# Patient Record
Sex: Female | Born: 2000 | Race: White | Hispanic: No | Marital: Single | Smoking: Never smoker
Health system: Southern US, Community
[De-identification: ages and names within clinical notes are randomized; demographics above are authoritative.]

## PROBLEM LIST (undated history)

## (undated) DIAGNOSIS — F909 Attention-deficit hyperactivity disorder, unspecified type: Secondary | ICD-10-CM

## (undated) DIAGNOSIS — L309 Dermatitis, unspecified: Secondary | ICD-10-CM

## (undated) DIAGNOSIS — F419 Anxiety disorder, unspecified: Secondary | ICD-10-CM

## (undated) DIAGNOSIS — T148XXA Other injury of unspecified body region, initial encounter: Secondary | ICD-10-CM

## (undated) DIAGNOSIS — I10 Essential (primary) hypertension: Secondary | ICD-10-CM

## (undated) DIAGNOSIS — E282 Polycystic ovarian syndrome: Secondary | ICD-10-CM

## (undated) DIAGNOSIS — E669 Obesity, unspecified: Secondary | ICD-10-CM

## (undated) DIAGNOSIS — F32A Depression, unspecified: Secondary | ICD-10-CM

## (undated) DIAGNOSIS — M7989 Other specified soft tissue disorders: Secondary | ICD-10-CM

## (undated) DIAGNOSIS — F329 Major depressive disorder, single episode, unspecified: Secondary | ICD-10-CM

## (undated) HISTORY — DX: Essential (primary) hypertension: I10

## (undated) HISTORY — DX: Obesity, unspecified: E66.9

## (undated) HISTORY — DX: Attention-deficit hyperactivity disorder, unspecified type: F90.9

## (undated) HISTORY — DX: Major depressive disorder, single episode, unspecified: F32.9

## (undated) HISTORY — PX: TONSILLECTOMY: SUR1361

## (undated) HISTORY — DX: Anxiety disorder, unspecified: F41.9

## (undated) HISTORY — DX: Polycystic ovarian syndrome: E28.2

## (undated) HISTORY — DX: Depression, unspecified: F32.A

## (undated) HISTORY — DX: Other specified soft tissue disorders: M79.89

---
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
## 2020-01-07 DIAGNOSIS — E669 Obesity, unspecified: Secondary | ICD-10-CM | POA: Insufficient documentation

## 2020-01-07 LAB — HEMOGLOBIN A1C: Hemoglobin A1C: 5.2

## 2020-01-07 LAB — CBC AND DIFFERENTIAL
HCT: 45 (ref 36–46)
Hemoglobin: 15.1 (ref 12.0–16.0)
Platelets: 215 (ref 150–399)
WBC: 7.7

## 2020-01-07 LAB — CBC: RBC: 5.21 — AB (ref 3.87–5.11)

## 2020-01-07 LAB — TSH: TSH: 2.24 (ref 0.41–5.90)

## 2020-02-01 ENCOUNTER — Encounter: Payer: BC Managed Care – PPO | Admitting: Physician Assistant

## 2020-02-03 ENCOUNTER — Encounter: Payer: Self-pay | Admitting: Physician Assistant

## 2020-02-03 ENCOUNTER — Other Ambulatory Visit: Payer: Self-pay

## 2020-02-03 ENCOUNTER — Ambulatory Visit (INDEPENDENT_AMBULATORY_CARE_PROVIDER_SITE_OTHER): Payer: 59 | Admitting: Physician Assistant

## 2020-02-03 DIAGNOSIS — F410 Panic disorder [episodic paroxysmal anxiety] without agoraphobia: Secondary | ICD-10-CM

## 2020-02-03 DIAGNOSIS — F902 Attention-deficit hyperactivity disorder, combined type: Secondary | ICD-10-CM | POA: Diagnosis not present

## 2020-02-03 MED ORDER — AMPHETAMINE-DEXTROAMPHETAMINE 20 MG PO TABS: 20.0000 mg | ORAL_TABLET | Freq: Every day | ORAL | 0 refills | Status: DC

## 2020-02-03 MED ORDER — HYDROXYZINE HCL 25 MG PO TABS: ORAL_TABLET | ORAL | 0 refills | Status: DC

## 2020-02-03 NOTE — Patient Instructions (Signed)
It was great to see you!  Please follow-up in 3 months to check in on your adhd, sooner if concerns.  Take care,  Jarold Motto PA-C

## 2020-02-03 NOTE — Progress Notes (Signed)
Jill Mccann is a 19 y.o. female is here for transfer of care.  I acted as a Neurosurgeon for [employer], PA-C Corky Mull, LPN  History of Present Illness:   Chief Complaint  Patient presents with  . Transfer of care  . ADHD  . Anxiety  . Depression    HPI  Pt is here for transfer of care today from Dr. Earlene Plater.  ADHD Pt would like to go back on Adderall 20 mg daily. Has been off medication for a year now. Pt is in college, having trouble focusing and helps with anxiety. Rarely took holidays from her medication. Was prior on Adderall 20 mg BID  Anxiety & Depression Pt following up, she would like to go back on medication. Pt is having trouble with depression right now due to COVID and family issues. Pt says her anxiety is okay. Denies SI/HI. Pt has tried therapy in the past but has trouble opening up. Hydroxyzine takes as needed for panic attacks.  She could not tolerate SSRI's due to making her stomach upset.    Health Maintenance Due  Topic Date Due  . HIV Screening  Never done    Past Medical History:  Diagnosis Date  . ADHD   . Anxiety   . Depression   . Eczema   . PCOS (polycystic ovarian syndrome)      Social History   Socioeconomic History  . Marital status: Single    Spouse name: Not on file  . Number of children: Not on file  . Years of education: Not on file  . Highest education level: Not on file  Occupational History  . Not on file  Tobacco Use  . Smoking status: Never Smoker  . Smokeless tobacco: Never Used  Substance and Sexual Activity  . Alcohol use: No  . Drug use: No  . Sexual activity: Never  Other Topics Concern  . Not on file  Social History Narrative   [employer] in [employer]   Live with parents   Hobbies: horses   Social Determinants of Health   Financial Resource Strain:   . Difficulty of Paying Living Expenses:   Food Insecurity:   . Worried About Programme researcher, broadcasting/film/video in the Last Year:   . Barista in the  Last Year:   Transportation Needs:   . Freight forwarder (Medical):   Marland Kitchen Lack of Transportation (Non-Medical):   Physical Activity:   . Days of Exercise per Week:   . Minutes of Exercise per Session:   Stress:   . Feeling of Stress :   Social Connections:   . Frequency of Communication with Friends and Family:   . Frequency of Social Gatherings with Friends and Family:   . Attends Religious Services:   . Active Member of Clubs or Organizations:   . Attends Banker Meetings:   Marland Kitchen Marital Status:   Intimate Partner Violence:   . Fear of Current or Ex-Partner:   . Emotionally Abused:   Marland Kitchen Physically Abused:   . Sexually Abused:     Past Surgical History:  Procedure Laterality Date  . TONSILLECTOMY      Family History  Problem Relation Age of Onset  . Arthritis Mother   . Depression Mother   . Hypertension Mother   . Miscarriages / [place] Mother   . Arthritis Father   . Diabetes Father   . Heart disease Father   . Hyperlipidemia Father   . Hypertension  Father   . Asthma Sister   . Stroke Maternal Grandmother   . Alcohol abuse Maternal Grandfather   . Early death Maternal Grandfather   . Hearing loss Maternal Grandfather   . Hyperlipidemia Maternal Grandfather   . Arthritis Paternal Grandmother   . Diabetes Paternal Grandfather   . Hypertension Paternal Grandfather   . Kidney disease Paternal Grandfather     PMHx, SurgHx, SocialHx, FamHx, Medications, and Allergies were reviewed in the Visit Navigator and updated as appropriate.   Patient Active Problem List   Diagnosis Date Noted  . Obesity 01/07/2020  . 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 Severe episode of recurrent major depressive disorder, without psychotic features ([place]) 08/13/2018  . Panic attacks 08/13/2018  . Attention deficit hyperactivity disorder (ADHD), combined type  08/13/2018  . Primary insomnia 08/13/2018  . PCOS (polycystic ovarian syndrome) 07/28/2018    Social History   Tobacco Use  . Smoking status: Never Smoker  . Smokeless tobacco: Never Used  Substance Use Topics  . Alcohol use: No  . Drug use: No    Current Medications and Allergies:    Current Outpatient Medications:  .  etonogestrel (NEXPLANON) 68 MG IMPL implant, Nexplanon 68 mg subdermal implant  Inject by subcutaneous route., Disp: , Rfl:  .  hydrOXYzine (ATARAX/VISTARIL) 25 MG tablet, TAKE 1 TAB 30-60 MINS PRIOR TO BEDTIME AS NEEDED FOR INSOMNIA/ANXIETY. MAY INCREASE TO 2 TABS, Disp: 60 tablet, Rfl: 0 .  amphetamine-dextroamphetamine (ADDERALL) 20 MG tablet, Take 1 tablet (20 mg total) by mouth 2 (two) times daily. (Patient not taking: Reported on 10/05/2019), Disp: 60 tablet, Rfl: 0 .  amphetamine-dextroamphetamine (ADDERALL) 20 MG tablet, Take 1 tablet (20 mg total) by mouth daily before breakfast., Disp: 30 tablet, Rfl: 0 .  [START ON 03/04/2020] amphetamine-dextroamphetamine (ADDERALL) 20 MG tablet, Take 1 tablet (20 mg total) by mouth daily before breakfast., Disp: 30 tablet, Rfl: 0 .  [START ON 04/03/2020] amphetamine-dextroamphetamine (ADDERALL) 20 MG tablet, Take 1 tablet (20 mg total) by mouth daily before breakfast., Disp: 30 tablet, Rfl: 0   Allergies  Allergen Reactions  . Hydrocodone Nausea And Vomiting    Review of Systems   ROS  Negative unless otherwise specified per HPI.  Vitals:   Vitals:   02/03/20 1335  BP: 130/80  Pulse: 75  Temp: 98.7 F (37.1 C)  TempSrc: Temporal  SpO2: 97%  Weight: 252 lb 4 oz (114.4 kg)  Height: 5' 3.5" (1.613 m)     Body mass index is 43.98 kg/m.   Physical Exam:    Physical Exam Vitals and nursing note reviewed.  Constitutional:      General: She is not in acute distress.    Appearance: She is well-developed. She is not ill-appearing or toxic-appearing.  Cardiovascular:     Rate and Rhythm: Normal rate and  regular rhythm.     Pulses: Normal pulses.     Heart sounds: Normal heart sounds, S1 normal and S2 normal.     Comments: No LE edema Pulmonary:     Effort: Pulmonary effort is normal.     Breath sounds: Normal breath sounds.  Skin:    General: Skin is warm and dry.  Neurological:     Mental Status: She is alert.     GCS: GCS eye subscore is 4. GCS verbal subscore is 5. GCS motor subscore is 6.  Psychiatric:        Speech:  Speech normal.        Behavior: Behavior normal. Behavior is cooperative.      Assessment and Plan:    Kadejah was seen today for transfer of care, adhd, anxiety and depression.  Diagnoses and all orders for this visit:  Attention deficit hyperactivity disorder (ADHD), combined type [place] Controlled Substance Database reviewed today regarding patient. Patient is compliant with [facility] regarding pharmacy use and one-prescribing provider. It is appropriate to continue current medication regimen. Drug contract completed today -- Adderall 20 mg daily -- refill for 3 months given.  Panic attacks Hydroxyzine refilled. She declines any further medication at this time. I discussed with patient that if they develop any SI, to tell someone immediately and seek medical attention. -     hydrOXYzine (ATARAX/VISTARIL) 25 MG tablet; TAKE 1 TAB 30-60 MINS PRIOR TO BEDTIME AS NEEDED FOR INSOMNIA/ANXIETY. MAY INCREASE TO 2 TABS  Other orders -     amphetamine-dextroamphetamine (ADDERALL) 20 MG tablet; Take 1 tablet (20 mg total) by mouth daily before breakfast. -     amphetamine-dextroamphetamine (ADDERALL) 20 MG tablet; Take 1 tablet (20 mg total) by mouth daily before breakfast. -     amphetamine-dextroamphetamine (ADDERALL) 20 MG tablet; Take 1 tablet (20 mg total) by mouth daily before breakfast.  Reviewed expectations re: course of current medical issues. . Discussed self-management of symptoms. . Outlined signs and symptoms indicating need for more acute intervention. . Patient  verbalized understanding and all questions were answered. . See orders for this visit as documented in the electronic medical record. . Patient received an After Visit Summary.  CMA or LPN served as scribe during this visit. History, Physical, and Plan performed by medical provider. The above documentation has been reviewed and is accurate and complete.  Jarold Motto, PA-C [place] [place] 02/03/2020  Follow-up: No follow-ups on file.

## 2020-02-18 ENCOUNTER — Encounter: Payer: Self-pay | Admitting: Physician Assistant

## 2020-03-05 ENCOUNTER — Other Ambulatory Visit: Payer: Self-pay | Admitting: Physician Assistant

## 2020-03-05 DIAGNOSIS — F410 Panic disorder [episodic paroxysmal anxiety] without agoraphobia: Secondary | ICD-10-CM

## 2020-03-07 NOTE — Telephone Encounter (Signed)
Ok for 90 day supply.  

## 2020-04-17 ENCOUNTER — Encounter: Payer: Self-pay | Admitting: Physician Assistant

## 2020-04-19 ENCOUNTER — Telehealth: Payer: 59 | Admitting: Physician Assistant

## 2020-04-20 ENCOUNTER — Ambulatory Visit: Payer: 59 | Admitting: Physician Assistant

## 2020-05-04 ENCOUNTER — Ambulatory Visit: Payer: 59 | Admitting: Physician Assistant

## 2020-05-04 DIAGNOSIS — Z0289 Encounter for other administrative examinations: Secondary | ICD-10-CM

## 2020-05-04 NOTE — Progress Notes (Deleted)
Jill Mccann is a 19 y.o. female is here to discuss: ADHD  I acted as a Neurosurgeon for [employer], PA-C [place], [place]  History of Present Illness:   No chief complaint on file.   HPI  ADHD  Anxiety Health Maintenance Due  Topic Date Due  . Hepatitis C Screening  Never done  . COVID-19 Vaccine (1) Never done  . HIV Screening  Never done    Past Medical History:  Diagnosis Date  . ADHD   . Anxiety   . Depression   . Eczema   . PCOS (polycystic ovarian syndrome)      Social History   Tobacco Use  . Smoking status: Never Smoker  . Smokeless tobacco: Never Used  Substance Use Topics  . Alcohol use: No  . Drug use: No    Past Surgical History:  Procedure Laterality Date  . TONSILLECTOMY      Family History  Problem Relation Age of Onset  . Arthritis Mother   . Depression Mother   . Hypertension Mother   . Miscarriages / [place] Mother   . Arthritis Father   . Diabetes Father   . Heart disease Father   . Hyperlipidemia Father   . Hypertension Father   . Asthma Sister   . Stroke Maternal Grandmother   . Alcohol abuse Maternal Grandfather   . Early death Maternal Grandfather   . Hearing loss Maternal Grandfather   . Hyperlipidemia Maternal Grandfather   . Arthritis Paternal Grandmother   . Diabetes Paternal Grandfather   . Hypertension Paternal Grandfather   . Kidney disease Paternal Grandfather     PMHx, SurgHx, SocialHx, FamHx, Medications, and Allergies were reviewed in the Visit Navigator and updated as appropriate.   Patient Active Problem List   Diagnosis Date Noted  . Obesity 01/07/2020  . 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 Severe episode of recurrent major depressive disorder, without psychotic features ([facility]) 08/13/2018  . Panic attacks 08/13/2018  . Attention deficit hyperactivity disorder (ADHD), combined type 08/13/2018   . Primary insomnia 08/13/2018  . PCOS (polycystic ovarian syndrome) 07/28/2018    Social History   Tobacco Use  . Smoking status: Never Smoker  . Smokeless tobacco: Never Used  Substance Use Topics  . Alcohol use: No  . Drug use: No    Current Medications and Allergies:    Current Outpatient Medications:  .  amphetamine-dextroamphetamine (ADDERALL) 20 MG tablet, Take 1 tablet (20 mg total) by mouth 2 (two) times daily. (Patient not taking: Reported on 10/05/2019), Disp: 60 tablet, Rfl: 0 .  amphetamine-dextroamphetamine (ADDERALL) 20 MG tablet, Take 1 tablet (20 mg total) by mouth daily before breakfast., Disp: 30 tablet, Rfl: 0 .  amphetamine-dextroamphetamine (ADDERALL) 20 MG tablet, Take 1 tablet (20 mg total) by mouth daily before breakfast., Disp: 30 tablet, Rfl: 0 .  amphetamine-dextroamphetamine (ADDERALL) 20 MG tablet, Take 1 tablet (20 mg total) by mouth daily before breakfast., Disp: 30 tablet, Rfl: 0 .  etonogestrel (NEXPLANON) 68 MG IMPL implant, Nexplanon 68 mg subdermal implant  Inject by subcutaneous route., Disp: , Rfl:  .  hydrOXYzine (ATARAX/VISTARIL) 25 MG tablet, TAKE 1 TAB 30-60 MINS PRIOR TO BEDTIME AS NEEDED FOR INSOMNIA/ANXIETY. MAY INCREASE TO 2 TABS, Disp: 180 tablet, Rfl: 1  Allergies  Allergen Reactions  . Hydrocodone Nausea And Vomiting    Review of Systems   ROS  Vitals:  There were no vitals  filed for this visit.   There is no height or weight on file to calculate BMI.   Physical Exam:    Physical Exam   Assessment and Plan:    There are no diagnoses linked to this encounter.  . Reviewed expectations re: course of current medical issues. . Discussed self-management of symptoms. . Outlined signs and symptoms indicating need for more acute intervention. . Patient verbalized understanding and all questions were answered. . See orders for this visit as documented in the electronic medical record. . Patient received an After Visit  Summary.  ***  Jarold Motto, PA-C [place], [facility] [place] 05/04/2020  Follow-up: No follow-ups on file.

## 2020-05-05 ENCOUNTER — Other Ambulatory Visit: Payer: Self-pay | Admitting: Family Medicine

## 2020-05-19 ENCOUNTER — Encounter: Payer: Self-pay | Admitting: Physician Assistant

## 2020-11-20 ENCOUNTER — Encounter: Payer: Self-pay | Admitting: Physician Assistant

## 2020-11-22 ENCOUNTER — Other Ambulatory Visit: Payer: Self-pay

## 2020-11-22 ENCOUNTER — Encounter: Payer: Self-pay | Admitting: Physician Assistant

## 2020-11-22 ENCOUNTER — Telehealth (INDEPENDENT_AMBULATORY_CARE_PROVIDER_SITE_OTHER): Payer: 59 | Admitting: Physician Assistant

## 2020-11-22 VITALS — Ht 63.5 in | Wt 230.0 lb

## 2020-11-22 DIAGNOSIS — R059 Cough, unspecified: Secondary | ICD-10-CM | POA: Diagnosis not present

## 2020-11-22 NOTE — Progress Notes (Signed)
Virtual Visit via Video   I connected with Jill Mccann on 11/22/20 at 11:30 AM EST by a video enabled telemedicine application and verified that I am speaking with the correct person using two identifiers. Location patient: Home Location provider: [facility], Office Persons participating in the virtual visit: Edwin Ackert, Jarold Motto PA-C, Corky Mull, LPN   I discussed the limitations of evaluation and management by telemedicine and the availability of in person appointments. The patient expressed understanding and agreed to proceed.  I acted as a Neurosurgeon for [employer], PA-C [employer], LPN   Subjective:   HPI:   URI Symptom onset: Thursday  Travel/contacts:  none  Vaccination status: 1 shot  Testing results: pt took 2 at home tests Neg, PCR test is pending at [facility] -- should return today or tomorrow  Patient endorses the following symptoms: Sore throat resolved now has ongoing cough and congestion  Patient denies the following symptoms: Fever or chills, chest tightness, SOB  Treatments tried: Dayquil & Nyquil  Patient risk factors: Current COVID-19 risk of complications score: 1 Smoking status: Jill Mccann  reports that she has never smoked. She has never used smokeless tobacco. If female, currently pregnant? []   Yes [x]   No  ROS: See pertinent positives and negatives per HPI.  Patient Active Problem List   Diagnosis Date Noted  . Obesity 01/07/2020  . 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 Severe episode of recurrent major depressive disorder, without psychotic features ([facility]) 08/13/2018  . Panic attacks 08/13/2018  . Attention deficit hyperactivity disorder (ADHD), combined type 08/13/2018  . Primary insomnia 08/13/2018  . PCOS (polycystic ovarian syndrome) 07/28/2018    Social History   Tobacco Use  . Smoking status: Never Smoker  .  Smokeless tobacco: Never Used  Substance Use Topics  . Alcohol use: No    Current Outpatient Medications:  .  etonogestrel (NEXPLANON) 68 MG IMPL implant, Nexplanon 68 mg subdermal implant  Inject by subcutaneous route., Disp: , Rfl:  .  hydrOXYzine (ATARAX/VISTARIL) 25 MG tablet, TAKE 1 TAB 30-60 MINS PRIOR TO BEDTIME AS NEEDED FOR INSOMNIA/ANXIETY. MAY INCREASE TO 2 TABS, Disp: 180 tablet, Rfl: 1  Allergies  Allergen Reactions  . Hydrocodone Nausea And Vomiting    Objective:   VITALS: Per patient if applicable, see vitals. GENERAL: Alert, appears well and in no acute distress. HEENT: Atraumatic, conjunctiva clear, no obvious abnormalities on inspection of external nose and ears. NECK: Normal movements of the head and neck. CARDIOPULMONARY: No increased WOB. Speaking in clear sentences. I:E ratio WNL.  MS: Moves all visible extremities without noticeable abnormality. PSYCH: Pleasant and cooperative, well-groomed. Speech normal rate and rhythm. Affect is appropriate. Insight and judgement are appropriate. Attention is focused, linear, and appropriate.  NEURO: CN grossly intact. Oriented as arrived to appointment on time with no prompting. Moves both UE equally.  SKIN: No obvious lesions, wounds, erythema, or cyanosis noted on face or hands.  Assessment and Plan:   [name] was seen today for covid symptoms.  Diagnoses and all orders for this visit:  Cough   No red flags on discussion, patient is not in any obvious distress during our visit. Discussed progression of most viral illnesses, and recommended supportive care at this point in time.  Recommend OTC mucinex DM to help with symptoms. Discussed over the counter supportive care options, including Tylenol 500 mg q 8 hours, with recommendations to push fluids and  rest. Reviewed return precautions including new/worsening fever, SOB, new/worsening cough, sudden onset changes of symptoms. Recommended need to self-quarantine and  practice social distancing until symptoms resolve. I recommend that patient follow-up if symptoms worsen or persist despite treatment x 7-10 days, sooner if needed.  I discussed the assessment and treatment plan with the patient. The patient was provided an opportunity to ask questions and all were answered. The patient agreed with the plan and demonstrated an understanding of the instructions.   The patient was advised to call back or seek an in-person evaluation if the symptoms worsen or if the condition fails to improve as anticipated.   CMA or LPN served as scribe during this visit. History, Physical, and Plan performed by medical provider. The above documentation has been reviewed and is accurate and complete.  [place], [place] 11/22/2020

## 2020-11-23 ENCOUNTER — Other Ambulatory Visit: Payer: Self-pay | Admitting: Physician Assistant

## 2020-11-23 MED ORDER — AZITHROMYCIN 250 MG PO TABS
ORAL_TABLET | ORAL | 0 refills | Status: DC
Start: 2020-11-23 — End: 2021-10-03

## 2021-02-21 HISTORY — PX: OTHER SURGICAL HISTORY: SHX169

## 2021-10-03 ENCOUNTER — Ambulatory Visit (INDEPENDENT_AMBULATORY_CARE_PROVIDER_SITE_OTHER): Payer: 59 | Admitting: Physician Assistant

## 2021-10-03 ENCOUNTER — Other Ambulatory Visit: Payer: Self-pay

## 2021-10-03 ENCOUNTER — Encounter: Payer: Self-pay | Admitting: Physician Assistant

## 2021-10-03 VITALS — BP 138/82 | HR 113 | Temp 98.2°F | Ht 63.0 in | Wt 299.4 lb

## 2021-10-03 DIAGNOSIS — F902 Attention-deficit hyperactivity disorder, combined type: Secondary | ICD-10-CM | POA: Diagnosis not present

## 2021-10-03 DIAGNOSIS — Z0001 Encounter for general adult medical examination with abnormal findings: Secondary | ICD-10-CM | POA: Diagnosis not present

## 2021-10-03 DIAGNOSIS — G43909 Migraine, unspecified, not intractable, without status migrainosus: Secondary | ICD-10-CM | POA: Insufficient documentation

## 2021-10-03 DIAGNOSIS — E669 Obesity, unspecified: Secondary | ICD-10-CM

## 2021-10-03 DIAGNOSIS — F332 Major depressive disorder, recurrent severe without psychotic features: Secondary | ICD-10-CM | POA: Diagnosis not present

## 2021-10-03 MED ORDER — LISDEXAMFETAMINE DIMESYLATE 30 MG PO CAPS: 30.0000 mg | ORAL_CAPSULE | Freq: Every day | ORAL | 0 refills | Status: DC

## 2021-10-03 NOTE — Progress Notes (Signed)
Subjective:    Jill Mccann is a 20 y.o. female and is here for a comprehensive physical exam.  HPI  Health Maintenance Due  Topic Date Due   COVID-19 Vaccine (1) Never done   Hepatitis C Screening  Never done   INFLUENZA VACCINE  05/22/2021    Acute Concerns: Obesity Since our previous visit on 11/22/20, Jill Mccann has gained bout 69 pounds which she believes to be caused by her depression. Reports she was getting her exercise by going out to her barn and riding horses. Since her decreased mood she has not had the motivation to do so.   Chronic Issues: Anxiety/Depression Jill Mccann is still experiencing a decrease in her mood. States it is not as bad as it was in the past but she has been told by those around her that they see a decline in her mood. Reports she has noticed that she has been sleeping more often and not wanting to do things that she used to enjoy. At this time she is taking hydroxyzine 12.5 mg as needed, about a couple of times a month. She feels this does work for her to prevent or treat her panic attacks. Denies SI/HI.  ADHD At this time, Jill Mccann states she is finding it hard to focus when it comes to school due to this issue. It doesn't make it easier that her classes are online as well. She is interested in restarting Adderall 20 mg daily or any other medication.   Health Maintenance: Immunizations -- Covid- Due at this time Influenza- Due at this time  Tdap- UTD; 2020 PAP -- Due at this time Bone Density -- N/A Dentistry- UTD Ophthalmology- UTD Diet -- Eats all food groups Sleep habits -- Normal schedule Exercise -- Not regularly at this time Current Weight -- Increased by 69 lbs due to decreased mood Weight History: Wt Readings from Last 10 Encounters:  10/03/21 299 lb 6.4 oz (135.8 kg)  11/22/20 230 lb (104.3 kg) (99 %, Z= 2.31)*  02/03/20 252 lb 4 oz (114.4 kg) (>99 %, Z= 2.48)*  10/05/19 245 lb (111.1 kg) (>99 %, Z= 2.42)*  02/17/19 240 lb 9.6 oz (109.1  kg) (>99 %, Z= 2.38)*  12/10/18 235 lb 6.4 oz (106.8 kg) (>99 %, Z= 2.35)*  10/28/18 250 lb (113.4 kg) (>99 %, Z= 2.46)*  09/08/18 243 lb (110.2 kg) (>99 %, Z= 2.41)*  08/29/18 248 lb (112.5 kg) (>99 %, Z= 2.45)*  08/27/18 248 lb 14.4 oz (112.9 kg) (>99 %, Z= 2.46)*   * Growth percentiles are based on CDC (Girls, 2-20 Years) data.   Body mass index is 53.04 kg/m. Mood -- Uncontrolled; No SI/HI.  No LMP recorded. Patient has had an implant. Period characteristics -- Normal Birth control -- Nexplanon implant     reports no history of alcohol use.  Tobacco Use: Low Risk    Smoking Tobacco Use: Never   Smokeless Tobacco Use: Never   Passive Exposure: Not on file     Depression screen [facility] 2/9 10/03/2021  Decreased Interest 0  Down, Depressed, Hopeless 3  PHQ - 2 Score 3  Altered sleeping -  Tired, decreased energy -  Change in appetite -  Feeling bad or failure about yourself  -  Trouble concentrating -  Moving slowly or fidgety/restless -  Suicidal thoughts -  PHQ-9 Score -  Difficult doing work/chores -     Other providers/specialists: Patient Care Team: Jarold Motto, [place] as PCP - General (Physician Assistant)  PMHx, SurgHx, SocialHx, Medications, and Allergies were reviewed in the Visit Navigator and updated as appropriate.   Past Medical History:  Diagnosis Date   ADHD    Anxiety    Depression    Eczema    PCOS (polycystic ovarian syndrome)      Past Surgical History:  Procedure Laterality Date   TONSILLECTOMY       Family History  Problem Relation Age of Onset   Arthritis Mother    Depression Mother    Hypertension Mother    Miscarriages / [place] Mother    Arthritis Father    Diabetes Father    Heart disease Father    Hyperlipidemia Father    Hypertension Father    Asthma Sister    Stroke Maternal Grandmother    Alcohol abuse Maternal Grandfather    Early death Maternal Grandfather    Hearing loss Maternal Grandfather     Hyperlipidemia Maternal Grandfather    Arthritis Paternal Grandmother    Diabetes Paternal Grandfather    Hypertension Paternal Grandfather    Kidney disease Paternal Grandfather     Social History   Tobacco Use   Smoking status: Never   Smokeless tobacco: Never  Substance Use Topics   Alcohol use: No   Drug use: No    Review of Systems:   Review of Systems  Constitutional:  Negative for chills, fever, malaise/fatigue and weight loss.  HENT:  Negative for hearing loss, sinus pain and sore throat.   Respiratory:  Negative for cough and hemoptysis.   Cardiovascular:  Negative for chest pain, palpitations, leg swelling and PND.  Gastrointestinal:  Negative for abdominal pain, constipation, diarrhea, heartburn, nausea and vomiting.  Genitourinary:  Negative for dysuria, frequency and urgency.  Musculoskeletal:  Negative for back pain, myalgias and neck pain.  Skin:  Negative for itching and rash.  Neurological:  Negative for dizziness, tingling, seizures and headaches.  Endo/Heme/Allergies:  Negative for polydipsia.  Psychiatric/Behavioral:  Negative for depression. The patient is not nervous/anxious.    Objective:   BP (!) 142/88    Pulse (!) 113    Temp 98.2 F (36.8 C) (Temporal)    Ht 5\' 3"  (1.6 m)    Wt 299 lb 6.4 oz (135.8 kg)    SpO2 98%    BMI 53.04 kg/m   General Appearance:    Alert, cooperative, no distress, appears stated age  Head:    Normocephalic, without obvious abnormality, atraumatic  Eyes:    PERRL, conjunctiva/corneas clear, EOM's intact, fundi    benign, both eyes  Ears:    Normal TM's and external ear canals, both ears  Nose:   Nares normal, septum midline, mucosa normal, no drainage    or sinus tenderness  Throat:   Lips, mucosa, and tongue normal; teeth and gums normal  Neck:   Supple, symmetrical, trachea midline, no adenopathy;    thyroid:  no enlargement/tenderness/nodules; no carotid   bruit or JVD  Back:     Symmetric, no curvature, ROM normal,  no CVA tenderness  Lungs:     Clear to auscultation bilaterally, respirations unlabored  Chest Wall:    No tenderness or deformity   Heart:    Regular rate and rhythm, S1 and S2 normal, no murmur, rub   or gallop  Breast Exam:    Deferred   Abdomen:     Soft, non-tender, bowel sounds active all four quadrants,    no masses, no organomegaly  Genitalia:    Normal female  without lesion, discharge or tenderness  Rectal:    Deferred  Extremities:   Extremities normal, atraumatic, no cyanosis or edema  Pulses:   2+ and symmetric all extremities  Skin:   Skin color, texture, turgor normal, no rashes or lesions  Lymph nodes:   Cervical, supraclavicular, and axillary nodes normal  Neurologic:   CNII-XII intact, normal strength, sensation and reflexes    throughout    Assessment/Plan:   Encounter for general adult medical examination with abnormal findings Today patient counseled on age appropriate routine health concerns for screening and prevention, each reviewed and up to date or declined. Immunizations reviewed and up to date or declined. Labs ordered and reviewed. Risk factors for depression reviewed and negative. Hearing function and visual acuity are intact. ADLs screened and addressed as needed. Functional ability and level of safety reviewed and appropriate. Education, counseling and referrals performed based on assessed risks today. Patient provided with a copy of personalized plan for preventive services.   Attention deficit hyperactivity disorder (ADHD), combined type Uncontrolled No red flags upon database review Start Vyvanse 30 mg daily; coupon given Follow up in a month, sooner if concerns occur   Severe episode of recurrent major depressive disorder, without psychotic features (HCC) Uncontrolled She would like to start medication for ADHD at this time and then revisit starting medication in 1 month Declines referral for talk therapy at this time Patient denies SI/HI at  today's visit I advised patient that if they develop any SI, to tell someone immediately and seek medical attention  Obesity, unspecified classification, unspecified obesity type, unspecified whether serious comorbidity present Will update labs, will start medication as indicated by lab results Encouraged patient to participate in regular exercise and make healthy dietary choices    Patient Counseling:   [x]     Nutrition: Stressed importance of moderation in sodium/caffeine intake, saturated fat and cholesterol, caloric balance, sufficient intake of fresh fruits, vegetables, fiber, calcium, iron, and 1 mg of folate supplement per day (for females capable of pregnancy).   [x]      Stressed the importance of regular exercise.    [x]     Substance Abuse: Discussed cessation/primary prevention of tobacco, alcohol, or other drug use; driving or other dangerous activities under the influence; availability of treatment for abuse.    [x]      Injury prevention: Discussed safety belts, safety helmets, smoke detector, smoking near bedding or upholstery.    [x]      Sexuality: Discussed sexually transmitted diseases, partner selection, use of condoms, avoidance of unintended pregnancy  and contraceptive alternatives.    [x]     Dental health: Discussed importance of regular tooth brushing, flossing, and dental visits.   [x]      Health maintenance and immunizations reviewed. Please refer to Health maintenance section.   I,Havlyn C Ratchford,acting as a for , PA.,have documented all relevant documentation on the behalf of , PA,as directed by  , PA while in the presence of , .  I, , Neurosurgeon, have reviewed all documentation for this visit. The documentation on 10/03/21 for the exam, diagnosis, procedures, and orders are all accurate and complete.   Jarold Motto, PA-C [facility]

## 2021-10-03 NOTE — Patient Instructions (Signed)
It was great to see you!  Start vyvanse 30 mg daily  Follow-up in 1 month  If you develop suicidal thoughts, please tell someone and immediately proceed to our local 24/7 crisis center, [facility]. [address], [place], [place] [address] [phone].  Please go to the lab for blood work.   Our office will call you with your results unless you have chosen to receive results via MyChart.  If your blood work is normal we will follow-up each year for physicals and as scheduled for chronic medical problems.  If anything is abnormal we will treat accordingly and get you in for a follow-up.  Take care,  Lelon Mast

## 2021-10-04 ENCOUNTER — Other Ambulatory Visit: Payer: Self-pay | Admitting: Physician Assistant

## 2021-10-04 DIAGNOSIS — E875 Hyperkalemia: Secondary | ICD-10-CM

## 2021-10-04 LAB — CBC WITH DIFFERENTIAL/PLATELET
Basophils Absolute: 0.1 10*3/uL (ref 0.0–0.1)
Basophils Relative: 0.8 % (ref 0.0–3.0)
Eosinophils Absolute: 0.2 10*3/uL (ref 0.0–0.7)
Eosinophils Relative: 1.8 % (ref 0.0–5.0)
HCT: 42.4 % (ref 36.0–46.0)
Hemoglobin: 14.1 g/dL (ref 12.0–15.0)
Lymphocytes Relative: 27.7 % (ref 12.0–46.0)
Lymphs Abs: 2.4 10*3/uL (ref 0.7–4.0)
MCHC: 33.3 g/dL (ref 30.0–36.0)
MCV: 86.8 fl (ref 78.0–100.0)
Monocytes Absolute: 0.5 10*3/uL (ref 0.1–1.0)
Monocytes Relative: 5.7 % (ref 3.0–12.0)
Neutro Abs: 5.6 10*3/uL (ref 1.4–7.7)
Neutrophils Relative %: 64 % (ref 43.0–77.0)
Platelets: 240 10*3/uL (ref 150.0–400.0)
RBC: 4.88 Mil/uL (ref 3.87–5.11)
RDW: 13.7 % (ref 11.5–14.6)
WBC: 8.7 10*3/uL (ref 4.5–10.5)

## 2021-10-04 LAB — COMPREHENSIVE METABOLIC PANEL
ALT: 25 U/L (ref 0–35)
AST: 25 U/L (ref 0–37)
Albumin: 4 g/dL (ref 3.5–5.2)
Alkaline Phosphatase: 70 U/L (ref 39–117)
BUN: 14 mg/dL (ref 6–23)
CO2: 26 mEq/L (ref 19–32)
Calcium: 9.7 mg/dL (ref 8.4–10.5)
Chloride: 105 mEq/L (ref 96–112)
Creatinine, Ser: 0.95 mg/dL (ref 0.40–1.20)
GFR: 86.4 mL/min (ref >60.00)
Glucose, Bld: 120 mg/dL — ABNORMAL HIGH (ref 70–99)
Potassium: 5.4 mEq/L — ABNORMAL HIGH (ref 3.5–5.1)
Sodium: 140 mEq/L (ref 135–145)
Total Bilirubin: 0.3 mg/dL (ref 0.2–1.2)
Total Protein: 7.5 g/dL (ref 6.0–8.3)

## 2021-10-04 LAB — TSH: TSH: 1.68 u[IU]/mL (ref 0.35–5.50)

## 2021-10-04 LAB — HEMOGLOBIN A1C: Hgb A1c MFr Bld: 5.6 % (ref 4.6–6.5)

## 2021-11-08 ENCOUNTER — Encounter: Payer: Self-pay | Admitting: Physician Assistant

## 2021-11-08 ENCOUNTER — Other Ambulatory Visit (HOSPITAL_BASED_OUTPATIENT_CLINIC_OR_DEPARTMENT_OTHER): Payer: Self-pay

## 2021-11-08 ENCOUNTER — Other Ambulatory Visit: Payer: Self-pay

## 2021-11-08 ENCOUNTER — Ambulatory Visit (INDEPENDENT_AMBULATORY_CARE_PROVIDER_SITE_OTHER): Payer: 59 | Admitting: Physician Assistant

## 2021-11-08 VITALS — BP 140/90 | HR 107 | Temp 98.3°F | Ht 63.0 in | Wt 293.4 lb

## 2021-11-08 DIAGNOSIS — E875 Hyperkalemia: Secondary | ICD-10-CM

## 2021-11-08 DIAGNOSIS — F902 Attention-deficit hyperactivity disorder, combined type: Secondary | ICD-10-CM | POA: Diagnosis not present

## 2021-11-08 DIAGNOSIS — F331 Major depressive disorder, recurrent, moderate: Secondary | ICD-10-CM | POA: Diagnosis not present

## 2021-11-08 DIAGNOSIS — R69 Illness, unspecified: Secondary | ICD-10-CM | POA: Diagnosis not present

## 2021-11-08 MED ORDER — LISDEXAMFETAMINE DIMESYLATE 30 MG PO CAPS
30.0000 mg | ORAL_CAPSULE | Freq: Every day | ORAL | 0 refills | Status: DC
  Filled 2021-11-08 (×2): qty 30, 30d supply, fill #0

## 2021-11-08 MED ORDER — AMPHETAMINE-DEXTROAMPHETAMINE 10 MG PO TABS
10.0000 mg | ORAL_TABLET | Freq: Every day | ORAL | 0 refills | Status: DC
  Filled 2021-11-08: qty 30, 30d supply, fill #0

## 2021-11-08 MED ORDER — LISDEXAMFETAMINE DIMESYLATE 30 MG PO CAPS: 30.0000 mg | ORAL_CAPSULE | Freq: Every day | ORAL | 0 refills | Status: DC

## 2021-11-08 NOTE — Patient Instructions (Signed)
It was great to see you!  Continue Vyvanse 30 mg daily  Take 5-10 mg of Adderall at lunchtime (-ish) to see if this can give you the boost that you need in the afternoon, but still allow you to sleep  Let's follow-up in 3 months, sooner if you have concerns.  Take care,  Jarold Motto PA-C

## 2021-11-08 NOTE — Progress Notes (Signed)
Jill Mccann is a 21 y.o. female here for a follow up of a pre-existing problem.  History of Present Illness:   Chief Complaint  Patient presents with   ADHD    Pt is here for f/u needs refill for Vyvanse    HPI  ADHD Today is 1 month follow-up of Vyvanse 30 mg daily. Feels like the vyvanse has improved her motivation. She has started going to [employer]. She doesn't feel like she has to immediately go home and go to bed after a long day. Denies: palpitations, chest pain, nausea or HA wit these medications.  Depression Feels improved with the Vyvanse 30 mg, see above. Denies SI/HI. Coping better.  Hyperkalemia Found on prior lab draw, suspect lab error. Will recheck today.   Past Medical History:  Diagnosis Date   ADHD    Anxiety    Depression    Eczema    PCOS (polycystic ovarian syndrome)      Social History   Tobacco Use   Smoking status: Never   Smokeless tobacco: Never  Substance Use Topics   Alcohol use: No   Drug use: No    Past Surgical History:  Procedure Laterality Date   TONSILLECTOMY      Family History  Problem Relation Age of Onset   Arthritis Mother    Depression Mother    Hypertension Mother    Miscarriages / [place] Mother    Arthritis Father    Diabetes Father    Heart disease Father    Hyperlipidemia Father    Hypertension Father    Asthma Sister    Stroke Maternal Grandmother    Alcohol abuse Maternal Grandfather    Early death Maternal Grandfather    Hearing loss Maternal Grandfather    Hyperlipidemia Maternal Grandfather    Arthritis Paternal Grandmother    Diabetes Paternal Grandfather    Hypertension Paternal Grandfather    Kidney disease Paternal Grandfather     Allergies  Allergen Reactions   Other Anaphylaxis   Hydrocodone Nausea And Vomiting    Current Medications:   Current Outpatient Medications:    etonogestrel (NEXPLANON) 68 MG IMPL implant, Nexplanon 68 mg subdermal implant  Inject 1 implant by  subcutaneous route., Disp: , Rfl:    hydrOXYzine (ATARAX/VISTARIL) 25 MG tablet, TAKE 1 TAB 30-60 MINS PRIOR TO BEDTIME AS NEEDED FOR INSOMNIA/ANXIETY. MAY INCREASE TO 2 TABS, Disp: 180 tablet, Rfl: 1   lisdexamfetamine (VYVANSE) 30 MG capsule, Take 1 capsule (30 mg total) by mouth daily., Disp: 30 capsule, Rfl: 0   Review of Systems:   ROS Negative unless otherwise specified per HPI.  Vitals:   Vitals:   11/08/21 1530  BP: 140/90  Pulse: (!) 107  Temp: 98.3 F (36.8 C)  TempSrc: Temporal  SpO2: 96%  Weight: 293 lb 6.1 oz (133.1 kg)  Height: 5\' 3"  (1.6 m)     Body mass index is 51.97 kg/m.  Physical Exam:   Physical Exam Vitals and nursing note reviewed.  Constitutional:      General: She is not in acute distress.    Appearance: She is well-developed. She is not ill-appearing or toxic-appearing.  Cardiovascular:     Rate and Rhythm: Normal rate and regular rhythm.     Pulses: Normal pulses.     Heart sounds: Normal heart sounds, S1 normal and S2 normal.  Pulmonary:     Effort: Pulmonary effort is normal.     Breath sounds: Normal breath sounds.  Skin:  General: Skin is warm and dry.  Neurological:     Mental Status: She is alert.     GCS: GCS eye subscore is 4. GCS verbal subscore is 5. GCS motor subscore is 6.  Psychiatric:        Speech: Speech normal.        Behavior: Behavior normal. Behavior is cooperative.    Assessment and Plan:   Severe episode of recurrent major depressive disorder, without psychotic features (HCC) Improving with time Declines medication such as SSRI Continue to work on ADHD  Attention deficit hyperactivity disorder (ADHD), combined type Improving with time Continue Vyvanse 30 mg daily Trial 5-10 mg Adderall at lunch to see if this can help with afternoon fatigue Follow-up in 3 months, sooner if concerns  Hyperkalemia Suspect lab error Recheck and provide recommendations accordingly  Jarold Motto, PA-C

## 2021-11-09 ENCOUNTER — Telehealth: Payer: Self-pay | Admitting: *Deleted

## 2021-11-09 LAB — BASIC METABOLIC PANEL
BUN: 11 mg/dL (ref 6–23)
CO2: 25 mEq/L (ref 19–32)
Calcium: 9.6 mg/dL (ref 8.4–10.5)
Chloride: 106 mEq/L (ref 96–112)
Creatinine, Ser: 0.97 mg/dL (ref 0.40–1.20)
GFR: 84.21 mL/min (ref >60.00)
Glucose, Bld: 86 mg/dL (ref 70–99)
Potassium: 4.7 mEq/L (ref 3.5–5.1)
Sodium: 141 mEq/L (ref 135–145)

## 2021-11-09 NOTE — Telephone Encounter (Signed)
Key: [facility] - PA Case ID: [id number] - Rx #: [id number] Sent to send today  Drug Vyvanse 30MG  capsules Form Waiting for determination

## 2021-11-10 ENCOUNTER — Other Ambulatory Visit (HOSPITAL_BASED_OUTPATIENT_CLINIC_OR_DEPARTMENT_OTHER): Payer: Self-pay

## 2021-11-18 NOTE — Telephone Encounter (Signed)
Deniedon January 20 Your PA request has been denied. Additional information will be provided in the denial communication. (Message [phone])

## 2021-11-21 ENCOUNTER — Other Ambulatory Visit (HOSPITAL_BASED_OUTPATIENT_CLINIC_OR_DEPARTMENT_OTHER): Payer: Self-pay

## 2021-11-21 MED ORDER — AMPHETAMINE-DEXTROAMPHET ER 15 MG PO CP24
15.0000 mg | ORAL_CAPSULE | ORAL | 0 refills | Status: DC
  Filled 2021-11-21: qty 30, 30d supply, fill #0

## 2021-11-21 NOTE — Telephone Encounter (Signed)
Patient states the adderrall XR would be fine.  Please send script to pharmacy.

## 2021-11-21 NOTE — Telephone Encounter (Signed)
Please see message and send Rx.

## 2021-11-21 NOTE — Addendum Note (Signed)
Addended by: Erlene Quan on: 11/21/2021 01:38 PM   Modules accepted: Orders

## 2021-11-21 NOTE — Telephone Encounter (Signed)
Left message on voicemail to call office.  

## 2021-11-21 NOTE — Telephone Encounter (Signed)
Spoke to pt told her Samantha sent in Adderall XR 15 mg for her trial this month.  I recommend that she send me a mychart message in a few weeks to let me know how its going with this. Pt verbalized understanding.

## 2022-02-06 ENCOUNTER — Ambulatory Visit: Payer: 59 | Admitting: Physician Assistant

## 2022-02-09 ENCOUNTER — Other Ambulatory Visit (HOSPITAL_BASED_OUTPATIENT_CLINIC_OR_DEPARTMENT_OTHER): Payer: Self-pay

## 2022-02-09 ENCOUNTER — Encounter: Payer: Self-pay | Admitting: Physician Assistant

## 2022-02-09 ENCOUNTER — Ambulatory Visit (INDEPENDENT_AMBULATORY_CARE_PROVIDER_SITE_OTHER): Payer: 59 | Admitting: Physician Assistant

## 2022-02-09 VITALS — BP 128/95 | HR 88 | Temp 98.2°F | Ht 63.0 in | Wt 298.0 lb

## 2022-02-09 DIAGNOSIS — F902 Attention-deficit hyperactivity disorder, combined type: Secondary | ICD-10-CM | POA: Diagnosis not present

## 2022-02-09 DIAGNOSIS — R69 Illness, unspecified: Secondary | ICD-10-CM | POA: Diagnosis not present

## 2022-02-09 DIAGNOSIS — F331 Major depressive disorder, recurrent, moderate: Secondary | ICD-10-CM | POA: Diagnosis not present

## 2022-02-09 DIAGNOSIS — Z23 Encounter for immunization: Secondary | ICD-10-CM

## 2022-02-09 MED ORDER — AMPHETAMINE-DEXTROAMPHET ER 15 MG PO CP24: 15.0000 mg | ORAL_CAPSULE | ORAL | 0 refills | Status: DC

## 2022-02-09 MED ORDER — AMPHETAMINE-DEXTROAMPHET ER 15 MG PO CP24
15.0000 mg | ORAL_CAPSULE | ORAL | 0 refills | Status: DC
  Filled 2022-02-09: qty 30, 30d supply, fill #0

## 2022-02-09 NOTE — Patient Instructions (Signed)
It was great to see you! ? ?Refill x 6 months. Keep up the good work and let me know if you need anything in the meantime. ? ?Let's follow-up in 6 months, sooner if you have concerns. ? ?Take care, ? ?Jarold Motto PA-C  ?

## 2022-02-09 NOTE — Progress Notes (Signed)
? ?  Acute Office Visit ? ?Subjective:  ? ?  ?Patient ID: Jill Mccann, female    DOB: 2001-02-18, 20 y.o.   MRN: [id number] ? ?Chief Complaint  ?Patient presents with  ? Depression  ? Anxiety  ? ADHD  ?  Pt is not liking Adderall, sometimes feels chest discomfort and at night her anxiety is worse.  ? ? ?HPI ? ?Patient is in today for : ? ?ADHD ?Currently takes Adderall XR 15 mg 3-4 days per week. If taking consecutive days, she will develop chest tightness. Denies: SOB, exertional symptoms, n/v. ? ?Anxiety/Depression ?Anxiety is overall manageable, still having some mild depressive episodes. Will take a hydroxyzine (half a tablet) if needed for panic feelings or insomnia and it is effective. Working on exercise. ? ? ?ROS ? ? ?   ?Objective:  ?  ?BP (!) 128/95 (BP Location: Left Arm, Patient Position: Sitting, Cuff Size: Large)   Pulse 88   Temp 98.2 ?F (36.8 ?C) (Temporal)   Ht 5\' 3"  (1.6 m)   Wt 298 lb (135.2 kg)   SpO2 98%   BMI 52.79 kg/m?  ? ?Physical Exam ?Vitals and nursing note reviewed.  ?Constitutional:   ?   General: She is not in acute distress. ?   Appearance: She is well-developed. She is not ill-appearing or toxic-appearing.  ?Cardiovascular:  ?   Rate and Rhythm: Normal rate and regular rhythm.  ?   Pulses: Normal pulses.  ?   Heart sounds: Normal heart sounds, S1 normal and S2 normal.  ?Pulmonary:  ?   Effort: Pulmonary effort is normal.  ?   Breath sounds: Normal breath sounds.  ?Skin: ?   General: Skin is warm and dry.  ?Neurological:  ?   Mental Status: She is alert.  ?   GCS: GCS eye subscore is 4. GCS verbal subscore is 5. GCS motor subscore is 6.  ?Psychiatric:     ?   Speech: Speech normal.     ?   Behavior: Behavior normal. Behavior is cooperative.  ? ? ?No results found for any visits on 02/09/22. ? ? ?   ?Assessment & Plan:  ? ?Moderate episode of recurrent major depressive disorder (HCC) ?Well controlled ?Denies need for med changes ?Denies SI/HI ?Follow-up in 6 months, sooner if  concerns ? ?Attention deficit hyperactivity disorder (ADHD), combined type ?No red flags ?PDMP reviewed ?Continue Adderall XR 15 mg prn ?Follow-up in 6 months, sooner if concerns ?If chest tightness changes, she was advised to let 02/11/22 know ? ? ?No orders of the defined types were placed in this encounter. ? ? ?No follow-ups on file. ? ?[place], PA ? ? ?

## 2022-02-09 NOTE — Addendum Note (Signed)
Addended by: Jimmye Norman on: 02/09/2022 09:44 AM ? ? Modules accepted: Orders ? ?

## 2022-04-16 ENCOUNTER — Other Ambulatory Visit: Payer: Self-pay | Admitting: Physician Assistant

## 2022-04-17 ENCOUNTER — Other Ambulatory Visit (HOSPITAL_BASED_OUTPATIENT_CLINIC_OR_DEPARTMENT_OTHER): Payer: Self-pay

## 2022-04-17 MED ORDER — AMPHETAMINE-DEXTROAMPHET ER 15 MG PO CP24
15.0000 mg | ORAL_CAPSULE | ORAL | 0 refills | Status: DC
  Filled 2022-04-17: qty 30, 30d supply, fill #0

## 2022-04-19 ENCOUNTER — Encounter: Payer: Self-pay | Admitting: Radiology

## 2022-04-19 ENCOUNTER — Ambulatory Visit (INDEPENDENT_AMBULATORY_CARE_PROVIDER_SITE_OTHER): Payer: 59 | Admitting: Radiology

## 2022-04-19 ENCOUNTER — Encounter: Payer: Self-pay | Admitting: Physician Assistant

## 2022-04-19 VITALS — BP 138/100 | Ht 64.0 in | Wt 295.0 lb

## 2022-04-19 DIAGNOSIS — Z01419 Encounter for gynecological examination (general) (routine) without abnormal findings: Secondary | ICD-10-CM | POA: Diagnosis not present

## 2022-04-19 DIAGNOSIS — R030 Elevated blood-pressure reading, without diagnosis of hypertension: Secondary | ICD-10-CM | POA: Diagnosis not present

## 2022-04-19 NOTE — Telephone Encounter (Signed)
Pt is scheduled for same day appt with [facility] on 04/20/22

## 2022-04-19 NOTE — Progress Notes (Signed)
   Jill Mccann 12/08/2000 [id number]   History:  21 y.o. presents for annual exam. No concerns. Happy with Nexplanon. Transfer from [facility], previous patient of mine there.   Gynecologic History No LMP recorded. Patient has had an implant.   Contraception/Family planning: Nexplanon Sexually active: yes   Obstetric History OB History  Gravida Para Term Preterm AB Living  0 0 0 0 0 0  SAB IAB Ectopic Multiple Live Births  0 0 0 0 0     The following portions of the patient's history were reviewed and updated as appropriate: allergies, current medications, past family history, past medical history, past social history, past surgical history, and problem list.  Review of Systems Pertinent items noted in HPI and remainder of comprehensive ROS otherwise negative.   Past medical history, past surgical history, family history and social history were all reviewed and documented in the EPIC chart.   Exam:  Body mass index is 50.64 kg/m.    04/19/2022    8:49 AM 04/19/2022    8:16 AM 02/09/2022    8:46 AM  Vitals with BMI  Height  5\' 4"  5\' 3"   Weight  295 lbs 298 lbs  BMI  50.61 52.8  Systolic 138 144  Diastolic 100 110 95  Pulse   88     General appearance:  Normal, obese Thyroid:  Symmetrical, normal in size, without palpable masses or nodularity. Respiratory  Auscultation:  Clear without wheezing or rhonchi Cardiovascular  Auscultation:  Regular rate, without rubs, murmurs or gallops  Edema/varicosities:  Not grossly evident Abdominal  Soft,nontender, without masses, guarding or rebound.  Liver/spleen:  No organomegaly noted  Hernia:  None appreciated  Skin  Inspection:  Grossly normal Breasts: Examined lying and sitting.   Right: Without masses, retractions, nipple discharge or axillary adenopathy.   Left: Without masses, retractions, nipple discharge or axillary adenopathy. Genitourinary   Deferred    Patient informed chaperone available to be  present for breast and pelvic exam. Patient has requested no chaperone to be present. Patient has been advised what will be completed during breast and pelvic exam.   Assessment/Plan:   1. Well woman exam with routine gynecological exam Pap at 21  2. Elevated blood pressure reading Will monitor at home, if it remains elevated f/u with PCP    Discussed SBE, pap and STI screening as directed/appropriate. Recommend of exercise weekly, including weight bearing exercise. Encouraged the use of seatbelts and sunscreen. Return in 1 year for annual or as needed.   245 WHNP-BC 8:33 AM 04/19/2022

## 2022-04-19 NOTE — Progress Notes (Signed)
Subjective:     Patient ID: Jill Mccann, female    DOB: Jan 19, 2001, 21 y.o.   MRN: [id number]  Chief Complaint  Patient presents with   Follow-up    Pt was seen at her OBGYN yesterday and would like to discuss BP reading. 138/100   HPI: Hypertension: Patient is currently maintained on the following medications for blood pressure: none - new DX Patient denies chest pain, headaches, shortness of breath or swelling. Last 3 blood pressure readings in our office are as follows: BP Readings from Last 3 Encounters:  04/20/22 132/82  04/19/22 (!) 138/100  02/09/22 (!) 128/95    Assessment & Plan:   Problem List Items Addressed This Visit       Cardiovascular and Mediastinum   Primary hypertension - Primary    New - several high readings over last 6 months, seen in GYN yesterday and advised to see PCP, states she had not taken her Adderall prior to the readings, nor taken yet today, but better today. Reports white coat syndrome yesterday and rushing to find the office. Denies increased stress in general.  pt gained 60+ lbs last year, but has maintained over last 55mos.  Starting low dose HCTZ, advised on use & SE. Advised on low sodium diet and importance of drinking at least 2L water qd, work on weight loss. F/U w/ PCP in 1 month.      Relevant Medications   hydrochlorothiazide (HYDRODIURIL) 12.5 MG tablet    Outpatient Medications Prior to Visit  Medication Sig Dispense Refill   amphetamine-dextroamphetamine (ADDERALL XR) 15 MG 24 hr capsule Take 1 capsule by mouth every morning. 30 capsule 0   amphetamine-dextroamphetamine (ADDERALL XR) 15 MG 24 hr capsule Take 1 capsule by mouth every morning. 30 capsule 0   etonogestrel (NEXPLANON) 68 MG IMPL implant Nexplanon 68 mg subdermal implant  Inject 1 implant by subcutaneous route.     hydrOXYzine (ATARAX/VISTARIL) 25 MG tablet TAKE 1 TAB 30-60 MINS PRIOR TO BEDTIME AS NEEDED FOR INSOMNIA/ANXIETY. MAY INCREASE TO 2 TABS 180 tablet 1    amphetamine-dextroamphetamine (ADDERALL XR) 15 MG 24 hr capsule Take 1 capsule by mouth every morning. 30 capsule 0   No facility-administered medications prior to visit.    Past Medical History:  Diagnosis Date   ADHD    Anxiety    Depression    Eczema    PCOS (polycystic ovarian syndrome)     Past Surgical History:  Procedure Laterality Date   nexplanon  02/21/2021   Left ARM   TONSILLECTOMY      Allergies  Allergen Reactions   Justicia Adhatoda (Malabar Nut Tree) [Justicia Adhatoda] Anaphylaxis   Other Anaphylaxis    Pecans   Hydrocodone Nausea And Vomiting       Objective:    Physical Exam Vitals and nursing note reviewed.  Constitutional:      Appearance: Normal appearance. She is obese.  Cardiovascular:     Rate and Rhythm: Normal rate and regular rhythm.  Pulmonary:     Effort: Pulmonary effort is normal.     Breath sounds: Normal breath sounds.  Musculoskeletal:        General: Normal range of motion.  Skin:    General: Skin is warm and dry.  Neurological:     Mental Status: She is alert.  Psychiatric:        Mood and Affect: Mood normal.        Behavior: Behavior normal.     BP 132/82 (  BP Location: Left Arm, Patient Position: Sitting, Cuff Size: Large)   Pulse 86   Temp 98.5 F (36.9 C) (Temporal)   Ht 5\' 4"  (1.626 m)   Wt 295 lb 6 oz (134 kg)   LMP  (LMP Unknown)   SpO2 99%   BMI 50.70 kg/m  Wt Readings from Last 3 Encounters:  04/20/22 295 lb 6 oz (134 kg)  04/19/22 295 lb (133.8 kg)  02/09/22 298 lb (135.2 kg)        Meds ordered this encounter  Medications   DISCONTD: hydrochlorothiazide (HYDRODIURIL) 12.5 MG tablet    Sig: Take 1 tablet (12.5 mg total) by mouth daily.    Dispense:  90 tablet    Refill:  3    Order Specific Question:   Supervising Provider    Answer:   ANDY, CAMILLE L [[id number]]   hydrochlorothiazide (HYDRODIURIL) 12.5 MG tablet    Sig: Take 1 tablet (12.5 mg total) by mouth daily.    Dispense:  30 tablet     Refill:  0    Ignore previous RX just sent    Order Specific Question:   Supervising Provider    Answer:   ANDY, CAMILLE L [[id number]]    02/11/22, NP

## 2022-04-20 ENCOUNTER — Ambulatory Visit (INDEPENDENT_AMBULATORY_CARE_PROVIDER_SITE_OTHER): Payer: 59 | Admitting: Family

## 2022-04-20 ENCOUNTER — Encounter: Payer: Self-pay | Admitting: Family

## 2022-04-20 ENCOUNTER — Other Ambulatory Visit (HOSPITAL_BASED_OUTPATIENT_CLINIC_OR_DEPARTMENT_OTHER): Payer: Self-pay

## 2022-04-20 VITALS — BP 132/82 | HR 86 | Temp 98.5°F | Ht 64.0 in | Wt 295.4 lb

## 2022-04-20 DIAGNOSIS — I10 Essential (primary) hypertension: Secondary | ICD-10-CM | POA: Diagnosis not present

## 2022-04-20 MED ORDER — HYDROCHLOROTHIAZIDE 12.5 MG PO TABS
12.5000 mg | ORAL_TABLET | Freq: Every day | ORAL | 0 refills | Status: DC
  Filled 2022-04-20: qty 30, 30d supply, fill #0

## 2022-04-20 MED ORDER — HYDROCHLOROTHIAZIDE 12.5 MG PO TABS
12.5000 mg | ORAL_TABLET | Freq: Every day | ORAL | 3 refills | Status: DC
  Filled 2022-04-20: qty 90, 90d supply, fill #0

## 2022-04-20 NOTE — Assessment & Plan Note (Signed)
New - several high readings over last 6 months, seen in GYN yesterday and advised to see PCP, states she had not taken her Adderall prior to the readings, nor taken yet today, but better today. Reports white coat syndrome yesterday and rushing to find the office. Denies increased stress in general.  pt gained 60+ lbs last year, but has maintained over last 11mos.  Starting low dose HCTZ, advised on use & SE. Advised on low sodium diet and importance of drinking at least 2L water qd, work on weight loss. F/U w/ PCP in 1 month.

## 2022-05-21 ENCOUNTER — Encounter (HOSPITAL_BASED_OUTPATIENT_CLINIC_OR_DEPARTMENT_OTHER): Payer: Self-pay

## 2022-05-21 ENCOUNTER — Ambulatory Visit (INDEPENDENT_AMBULATORY_CARE_PROVIDER_SITE_OTHER): Payer: 59 | Admitting: Physician Assistant

## 2022-05-21 ENCOUNTER — Encounter: Payer: Self-pay | Admitting: Physician Assistant

## 2022-05-21 ENCOUNTER — Other Ambulatory Visit (HOSPITAL_BASED_OUTPATIENT_CLINIC_OR_DEPARTMENT_OTHER): Payer: Self-pay

## 2022-05-21 VITALS — BP 130/90 | HR 83 | Temp 98.7°F | Ht 64.0 in | Wt 293.0 lb

## 2022-05-21 DIAGNOSIS — F902 Attention-deficit hyperactivity disorder, combined type: Secondary | ICD-10-CM

## 2022-05-21 DIAGNOSIS — F331 Major depressive disorder, recurrent, moderate: Secondary | ICD-10-CM | POA: Diagnosis not present

## 2022-05-21 DIAGNOSIS — I10 Essential (primary) hypertension: Secondary | ICD-10-CM

## 2022-05-21 DIAGNOSIS — R69 Illness, unspecified: Secondary | ICD-10-CM | POA: Diagnosis not present

## 2022-05-21 MED ORDER — ATOMOXETINE HCL 25 MG PO CAPS
25.0000 mg | ORAL_CAPSULE | Freq: Every day | ORAL | 0 refills | Status: DC
  Filled 2022-05-21: qty 30, 30d supply, fill #0

## 2022-05-21 NOTE — Patient Instructions (Signed)
It was great to see you!  Ideally please start checking your blood pressure for me Continue your hydrochlorothiazide 12.5 mg daily The goal is for your blood pressure to be < 130/80  Please stop adderall and start the strattera. Send me a message in a few days to let me know if this is working well for you  If you are able to check your blood pressure regularly, please follow-up with me in 3 months If you are not able to check your blood pressure, please follow-up with me in 1 month  Take care,  Jarold Motto PA-C

## 2022-05-21 NOTE — Progress Notes (Signed)
Jill Mccann is a 21 y.o. female here for a new problem of hypertension.   History of Present Illness:   Chief Complaint  Patient presents with   Hypertension    Pt has not been checking Bp at home.    HPI  HTN Patient here to follow-up. Currently taking HCTZ 12.5 mg daily with no complications. She was started on HCTZ about a month ago by NP Dulce Sellar.  She was found to have elevated BP readings at her OB/GYN. At home blood pressure readings are: not checked. Patient denies chest pain, SOB, blurred vision, dizziness, unusual headaches, lower leg swelling. Patient is  compliant with medication. Denies excessive caffeine intake, stimulant usage, excessive alcohol intake, or increase in salt consumption.  BP Readings from Last 3 Encounters:  05/21/22 130/90  04/20/22 132/82  04/19/22 (!) 138/100    ADHD Currently takes Adderall XR 15 mg daily with no complications. She is tolerating her medication well with no adverse side effects. States her mood has been better since starting  Adderall. . Per pt, she was on vacation and has not taken her regimen for 3-4 days. States she was not feeling well at that time. Felt like sleeping all day. Denies any other concerns.   Depression Patient states she has been doing well related to this. Her mood has been better at this time. She has tried Zoloft and Hydroxyzine in past for anxiety. Has tried talk therapy in past as well. Currently takes Hydroxyzine 25 mg as needed. She is tolerating this well and denies any issue. No other specific treatment tried. No SI/HI.   Past Medical History:  Diagnosis Date   ADHD    Anxiety    Depression    Eczema    PCOS (polycystic ovarian syndrome)      Social History   Tobacco Use   Smoking status: Never   Smokeless tobacco: Never  Substance Use Topics   Alcohol use: No   Drug use: No    Past Surgical History:  Procedure Laterality Date   nexplanon  02/21/2021   Left ARM   TONSILLECTOMY       Family History  Problem Relation Age of Onset   Arthritis Mother    Depression Mother    Hypertension Mother    Miscarriages / [place] Mother    Arthritis Father    Diabetes Father    Heart disease Father    Hyperlipidemia Father    Hypertension Father    Asthma Sister    Stroke Maternal Grandmother    Alcohol abuse Maternal Grandfather    Early death Maternal Grandfather    Hearing loss Maternal Grandfather    Hyperlipidemia Maternal Grandfather    Arthritis Paternal Grandmother    Diabetes Paternal Grandfather    Hypertension Paternal Grandfather    Kidney disease Paternal Grandfather     Allergies  Allergen Reactions   Justicia Adhatoda ([place]abar Nut Tree) [Justicia Adhatoda] Anaphylaxis   Other Anaphylaxis    Pecans   Hydrocodone Nausea And Vomiting    Current Medications:   Current Outpatient Medications:    atomoxetine (STRATTERA) 25 MG capsule, Take 1 capsule (25 mg total) by mouth daily., Disp: 30 capsule, Rfl: 0   etonogestrel (NEXPLANON) 68 MG IMPL implant, Nexplanon 68 mg subdermal implant  Inject 1 implant by subcutaneous route., Disp: , Rfl:    hydrochlorothiazide (HYDRODIURIL) 12.5 MG tablet, Take 1 tablet (12.5 mg total) by mouth daily., Disp: 30 tablet, Rfl: 0   hydrOXYzine (ATARAX/VISTARIL) 25  MG tablet, TAKE 1 TAB 30-60 MINS PRIOR TO BEDTIME AS NEEDED FOR INSOMNIA/ANXIETY. MAY INCREASE TO 2 TABS, Disp: 180 tablet, Rfl: 1   Review of Systems:   ROS Negative unless otherwise specified per HPI.   Vitals:   Vitals:   05/21/22 1346 05/21/22 1423  BP: 130/90 130/90  Pulse: 88 83  Temp: 98.7 F (37.1 C)   TempSrc: Temporal   SpO2: 98%   Weight: 293 lb (132.9 kg)   Height: 5\' 4"  (1.626 m)      Body mass index is 50.29 kg/m.  Physical Exam:   Physical Exam Vitals and nursing note reviewed.  Constitutional:      General: She is not in acute distress.    Appearance: She is well-developed. She is not ill-appearing or toxic-appearing.   Cardiovascular:     Rate and Rhythm: Normal rate and regular rhythm.     Pulses: Normal pulses.     Heart sounds: Normal heart sounds, S1 normal and S2 normal.  Pulmonary:     Effort: Pulmonary effort is normal.     Breath sounds: Normal breath sounds.  Skin:    General: Skin is warm and dry.  Neurological:     Mental Status: She is alert.     GCS: GCS eye subscore is 4. GCS verbal subscore is 5. GCS motor subscore is 6.  Psychiatric:        Speech: Speech normal.        Behavior: Behavior normal. Behavior is cooperative.     Assessment and Plan:   Primary hypertension Above goal Continue hctz 12.5 mg daily Follow-up in 1-3 months (depending on if she is able to monitor her BP at home or not)  Moderate episode of recurrent major depressive disorder (HCC) Improved per patient with stimulant Continue to monitor  Attention deficit hyperactivity disorder (ADHD), combined type Well controlled Stop Adderall XR 15 mg due to elevated BP Trial strattera 25 mg daily Message in a few days to let us know how this new med is going Follow-up in 1 - 3 months Consider wellbutrin if ineffective  I,Savera Zaman,acting as a scribe for [place], PA.,have documented all relevant documentation on the behalf of [employer], PA,as directed by  Jarold Motto, PA while in the presence of Jarold Motto, Jarold Motto.   I, [place], Jarold Motto, have reviewed all documentation for this visit. The documentation on 05/21/22 for the exam, diagnosis, procedures, and orders are all accurate and complete.   05/23/22, PA-C

## 2022-05-22 ENCOUNTER — Other Ambulatory Visit (HOSPITAL_BASED_OUTPATIENT_CLINIC_OR_DEPARTMENT_OTHER): Payer: Self-pay

## 2022-05-23 ENCOUNTER — Other Ambulatory Visit: Payer: Self-pay | Admitting: Family

## 2022-05-23 ENCOUNTER — Other Ambulatory Visit (HOSPITAL_BASED_OUTPATIENT_CLINIC_OR_DEPARTMENT_OTHER): Payer: Self-pay

## 2022-05-23 DIAGNOSIS — I10 Essential (primary) hypertension: Secondary | ICD-10-CM

## 2022-05-23 MED ORDER — HYDROCHLOROTHIAZIDE 12.5 MG PO TABS
12.5000 mg | ORAL_TABLET | Freq: Every day | ORAL | 0 refills | Status: DC
  Filled 2022-05-23 (×2): qty 30, 30d supply, fill #0

## 2022-05-30 ENCOUNTER — Encounter: Payer: Self-pay | Admitting: Physician Assistant

## 2022-06-19 ENCOUNTER — Other Ambulatory Visit (HOSPITAL_BASED_OUTPATIENT_CLINIC_OR_DEPARTMENT_OTHER): Payer: Self-pay

## 2022-06-19 ENCOUNTER — Other Ambulatory Visit: Payer: Self-pay | Admitting: Family

## 2022-06-19 ENCOUNTER — Other Ambulatory Visit: Payer: Self-pay | Admitting: Physician Assistant

## 2022-06-19 DIAGNOSIS — I10 Essential (primary) hypertension: Secondary | ICD-10-CM

## 2022-06-19 MED ORDER — HYDROCHLOROTHIAZIDE 12.5 MG PO TABS
12.5000 mg | ORAL_TABLET | Freq: Every day | ORAL | 2 refills | Status: DC
  Filled 2022-06-19: qty 30, 30d supply, fill #0
  Filled 2022-07-31: qty 30, 30d supply, fill #1

## 2022-06-20 ENCOUNTER — Other Ambulatory Visit (HOSPITAL_BASED_OUTPATIENT_CLINIC_OR_DEPARTMENT_OTHER): Payer: Self-pay

## 2022-06-20 MED ORDER — ATOMOXETINE HCL 25 MG PO CAPS
25.0000 mg | ORAL_CAPSULE | Freq: Every day | ORAL | 0 refills | Status: DC
Start: 2022-06-20 — End: 2022-07-31
  Filled 2022-06-20: qty 30, 30d supply, fill #0

## 2022-06-20 NOTE — Telephone Encounter (Signed)
Pt requesting refill for Strattera 25 mg. Last OV 05/21/2022.

## 2022-07-16 ENCOUNTER — Encounter: Payer: Self-pay | Admitting: *Deleted

## 2022-07-31 ENCOUNTER — Other Ambulatory Visit: Payer: Self-pay | Admitting: Physician Assistant

## 2022-07-31 ENCOUNTER — Other Ambulatory Visit (HOSPITAL_BASED_OUTPATIENT_CLINIC_OR_DEPARTMENT_OTHER): Payer: Self-pay

## 2022-07-31 NOTE — Telephone Encounter (Signed)
Pt requesting refill for Strattera 25 mg. Pt is scheduled to see you 08/13/2022.

## 2022-08-01 ENCOUNTER — Other Ambulatory Visit (HOSPITAL_BASED_OUTPATIENT_CLINIC_OR_DEPARTMENT_OTHER): Payer: Self-pay

## 2022-08-01 MED ORDER — ATOMOXETINE HCL 25 MG PO CAPS
25.0000 mg | ORAL_CAPSULE | Freq: Every day | ORAL | 0 refills | Status: DC
  Filled 2022-08-01: qty 30, 30d supply, fill #0

## 2022-08-13 ENCOUNTER — Encounter (HOSPITAL_BASED_OUTPATIENT_CLINIC_OR_DEPARTMENT_OTHER): Payer: Self-pay | Admitting: Pharmacist

## 2022-08-13 ENCOUNTER — Other Ambulatory Visit (HOSPITAL_BASED_OUTPATIENT_CLINIC_OR_DEPARTMENT_OTHER): Payer: Self-pay

## 2022-08-13 ENCOUNTER — Encounter: Payer: Self-pay | Admitting: Physician Assistant

## 2022-08-13 ENCOUNTER — Ambulatory Visit (INDEPENDENT_AMBULATORY_CARE_PROVIDER_SITE_OTHER): Payer: 59 | Admitting: Physician Assistant

## 2022-08-13 VITALS — BP 140/100 | HR 104 | Temp 97.7°F | Ht 64.0 in | Wt 289.0 lb

## 2022-08-13 DIAGNOSIS — I10 Essential (primary) hypertension: Secondary | ICD-10-CM | POA: Diagnosis not present

## 2022-08-13 DIAGNOSIS — Z23 Encounter for immunization: Secondary | ICD-10-CM | POA: Diagnosis not present

## 2022-08-13 DIAGNOSIS — F902 Attention-deficit hyperactivity disorder, combined type: Secondary | ICD-10-CM | POA: Diagnosis not present

## 2022-08-13 DIAGNOSIS — E669 Obesity, unspecified: Secondary | ICD-10-CM

## 2022-08-13 LAB — BASIC METABOLIC PANEL
BUN: 15 mg/dL (ref 6–23)
CO2: 26 mEq/L (ref 19–32)
Calcium: 9.2 mg/dL (ref 8.4–10.5)
Chloride: 105 mEq/L (ref 96–112)
Creatinine, Ser: 0.91 mg/dL (ref 0.40–1.20)
GFR: 90.43 mL/min (ref >60.00)
Glucose, Bld: 88 mg/dL (ref 70–99)
Potassium: 3.9 mEq/L (ref 3.5–5.1)
Sodium: 138 mEq/L (ref 135–145)

## 2022-08-13 LAB — HEMOGLOBIN A1C: Hgb A1c MFr Bld: 5.5 % (ref 4.6–6.5)

## 2022-08-13 MED ORDER — ATOMOXETINE HCL 60 MG PO CAPS
60.0000 mg | ORAL_CAPSULE | Freq: Every day | ORAL | 0 refills | Status: DC
  Filled 2022-08-13: qty 30, 30d supply, fill #0

## 2022-08-13 MED ORDER — LOSARTAN POTASSIUM-HCTZ 50-12.5 MG PO TABS
1.0000 | ORAL_TABLET | Freq: Every day | ORAL | 1 refills | Status: DC
  Filled 2022-08-13: qty 30, 30d supply, fill #0
  Filled 2022-09-17: qty 30, 30d supply, fill #1
  Filled 2022-10-24: qty 30, 30d supply, fill #2
  Filled 2022-11-30: qty 30, 30d supply, fill #3
  Filled 2023-01-02 – 2023-01-05 (×2): qty 30, 30d supply, fill #4
  Filled 2023-02-04: qty 30, 30d supply, fill #5

## 2022-08-13 MED ORDER — METFORMIN HCL 500 MG PO TABS
500.0000 mg | ORAL_TABLET | Freq: Two times a day (BID) | ORAL | 1 refills | Status: DC
  Filled 2022-08-13: qty 60, 30d supply, fill #0
  Filled 2022-09-17: qty 60, 30d supply, fill #1
  Filled 2022-10-24: qty 60, 30d supply, fill #2

## 2022-08-13 NOTE — Patient Instructions (Addendum)
It was great to see you!  Change your hydrochlorothiazide to losartan-hydrochlorothiazide -- this has been sent in. Please continue to monitor your blood pressures.  Start metformin 500 mg daily for your PCOS and weight loss.  Increase strattera to 60 mg. Message me in a few weeks on how this dose is going.  Let's follow-up in 2-3 months for a physical, sooner if you have concerns.  If a referral was placed today --> you will be contacted for an appointment. Please note that routine referrals can sometimes take up to 3-4 weeks to process. Please call our office if you haven't heard anything after this time frame.  If blood work, urine studies, or any imaging was ordered today -->  we will release your results to you on your MyChart account (if you have chosen to sign up for this) with further instructions. You may see the results before I do, but when I review them I will send you a message with my report or have my staff call you if things need to be discussed. Please reply to my message with any questions.   Take care,  [name] PA-C

## 2022-08-13 NOTE — Progress Notes (Addendum)
Jill Mccann is a 21 y.o. female here for a follow up of a pre-existing problem.  History of Present Illness:   Chief Complaint  Patient presents with   Depression   Hypertension    Pt has been checking blood pressure, has been running high. Pt forgot paper with readings on.    HPI  Hypertension Patient reports to regularly check blood pressure at home and states it has been running high lately. She is compliant on 12.5 mg Hydrodiuril medication. She denies any swelling in legs or frequency with urination. BP Readings from Last 3 Encounters:  08/13/22 (!) 140/100  05/21/22 130/90  04/20/22 132/82   Pulse Readings from Last 3 Encounters:  08/13/22 (!) 104  05/21/22 83  04/20/22 86   ADHD Patient is compliant on 25 mg Strattera daily, but reports that she does not feel a difference with no adverse side effects. She is requesting to increase dosage. Denies SI/HI.  Obesity She is continuing to have issues with losing weight. She is down about 4 lb since last seeing me. She is exercising regularly.   Past Medical History:  Diagnosis Date   ADHD    Anxiety    Depression    Eczema    PCOS (polycystic ovarian syndrome)      Social History   Tobacco Use   Smoking status: Never   Smokeless tobacco: Never  Substance Use Topics   Alcohol use: No   Drug use: No    Past Surgical History:  Procedure Laterality Date   nexplanon  02/21/2021   Left ARM   TONSILLECTOMY      Family History  Problem Relation Age of Onset   Arthritis Mother    Depression Mother    Hypertension Mother    Miscarriages / [place] Mother    Arthritis Father    Diabetes Father    Heart disease Father    Hyperlipidemia Father    Hypertension Father    Asthma Sister    Stroke Maternal Grandmother    Alcohol abuse Maternal Grandfather    Early death Maternal Grandfather    Hearing loss Maternal Grandfather    Hyperlipidemia Maternal Grandfather    Arthritis Paternal Grandmother     Diabetes Paternal Grandfather    Hypertension Paternal Grandfather    Kidney disease Paternal Grandfather     Allergies  Allergen Reactions   Justicia Adhatoda ([place]abar Nut Tree) [Justicia Adhatoda] Anaphylaxis   Other Anaphylaxis    Pecans   Hydrocodone Nausea And Vomiting    Current Medications:   Current Outpatient Medications:    atomoxetine (STRATTERA) 60 MG capsule, Take 1 capsule (60 mg total) by mouth daily., Disp: 30 capsule, Rfl: 0   etonogestrel (NEXPLANON) 68 MG IMPL implant, Nexplanon 68 mg subdermal implant  Inject 1 implant by subcutaneous route., Disp: , Rfl:    losartan-hydrochlorothiazide (HYZAAR) 50-12.5 MG tablet, Take 1 tablet by mouth daily., Disp: 90 tablet, Rfl: 1   metFORMIN (GLUCOPHAGE) 500 MG tablet, Take 1 tablet (500 mg total) by mouth 2 (two) times daily with a meal., Disp: 90 tablet, Rfl: 1   hydrOXYzine (ATARAX/VISTARIL) 25 MG tablet, TAKE 1 TAB 30-60 MINS PRIOR TO BEDTIME AS NEEDED FOR INSOMNIA/ANXIETY. MAY INCREASE TO 2 TABS (Patient not taking: Reported on 08/13/2022), Disp: 180 tablet, Rfl: 1   Review of Systems:   ROS Negative unless otherwise specified per HPI.  Vitals:   Vitals:   08/13/22 0821  BP: (!) 140/100  Pulse: (!) 104  Temp: 97.7  F (36.5 C)  TempSrc: Temporal  SpO2: 96%  Weight: 289 lb (131.1 kg)  Height: 5\' 4"  (1.626 m)     Body mass index is 49.61 kg/m.  Physical Exam:   Physical Exam Constitutional:      General: She is not in acute distress.    Appearance: Normal appearance. She is not ill-appearing.  HENT:     Head: Normocephalic and atraumatic.     Right Ear: External ear normal.     Left Ear: External ear normal.  Eyes:     Extraocular Movements: Extraocular movements intact.     Pupils: Pupils are equal, round, and reactive to light.  Cardiovascular:     Rate and Rhythm: Normal rate and regular rhythm.     Heart sounds: Normal heart sounds. No murmur heard.    No gallop.  Pulmonary:     Effort:  Pulmonary effort is normal. No respiratory distress.     Breath sounds: Normal breath sounds. No wheezing or rales.  Skin:    General: Skin is warm and dry.  Neurological:     Mental Status: She is alert and oriented to person, place, and time.  Psychiatric:        Judgment: Judgment normal.     Assessment and Plan:   Primary hypertension Above goal No evidence of end-organ damage Stop HCTZ and start losartan-hctz 50-12.5 mg daily Follow-up in 2 months for CPE Continue to monitor BP  Attention deficit hyperactivity disorder (ADHD), combined type Uncontrolled Increase strattera to 60 mg daily Send me a message 1-2 weeks in if would like further increase Follow-up in 2 months, sooner if concerns  Obesity, unspecified classification, unspecified obesity type, unspecified whether serious comorbidity present Uncontrolled She would be a great candidate for GLP-1 RA's however, these are very difficult to find in stock right now Recommend trialing 500 mg metformin Consider increase at follow-up   I, Luna Glasgow, acting as a Education administrator for [employer], PA.,have documented all relevant documentation on the behalf of [name], PA,as directed by  [name], PA while in the presence of [name], [place].   I, [name], [place], have reviewed all documentation for this visit. The documentation on 08/13/22 for the exam, diagnosis, procedures, and orders are all accurate and complete.  [name], PA-C

## 2022-08-13 NOTE — Addendum Note (Signed)
Addended by: Marian Sorrow on: 08/13/2022 08:58 AM   Modules accepted: Orders

## 2022-08-14 ENCOUNTER — Other Ambulatory Visit (HOSPITAL_BASED_OUTPATIENT_CLINIC_OR_DEPARTMENT_OTHER): Payer: Self-pay

## 2022-09-12 ENCOUNTER — Encounter: Payer: Self-pay | Admitting: Physician Assistant

## 2022-09-12 ENCOUNTER — Ambulatory Visit: Payer: 59 | Admitting: Physician Assistant

## 2022-09-12 ENCOUNTER — Other Ambulatory Visit (HOSPITAL_BASED_OUTPATIENT_CLINIC_OR_DEPARTMENT_OTHER): Payer: Self-pay

## 2022-09-12 VITALS — BP 128/86 | HR 125 | Temp 98.0°F | Ht 64.0 in | Wt 282.5 lb

## 2022-09-12 DIAGNOSIS — F902 Attention-deficit hyperactivity disorder, combined type: Secondary | ICD-10-CM | POA: Diagnosis not present

## 2022-09-12 DIAGNOSIS — I10 Essential (primary) hypertension: Secondary | ICD-10-CM

## 2022-09-12 DIAGNOSIS — Z23 Encounter for immunization: Secondary | ICD-10-CM | POA: Diagnosis not present

## 2022-09-12 DIAGNOSIS — R002 Palpitations: Secondary | ICD-10-CM | POA: Diagnosis not present

## 2022-09-12 LAB — COMPREHENSIVE METABOLIC PANEL
ALT: 28 U/L (ref 0–35)
AST: 17 U/L (ref 0–37)
Albumin: 4.3 g/dL (ref 3.5–5.2)
Alkaline Phosphatase: 67 U/L (ref 39–117)
BUN: 13 mg/dL (ref 6–23)
CO2: 25 mEq/L (ref 19–32)
Calcium: 9.2 mg/dL (ref 8.4–10.5)
Chloride: 103 mEq/L (ref 96–112)
Creatinine, Ser: 0.85 mg/dL (ref 0.40–1.20)
GFR: 98.09 mL/min (ref >60.00)
Glucose, Bld: 77 mg/dL (ref 70–99)
Potassium: 4.1 mEq/L (ref 3.5–5.1)
Sodium: 137 mEq/L (ref 135–145)
Total Bilirubin: 0.6 mg/dL (ref 0.2–1.2)
Total Protein: 7 g/dL (ref 6.0–8.3)

## 2022-09-12 LAB — CBC WITH DIFFERENTIAL/PLATELET
Basophils Absolute: 0.1 10*3/uL (ref 0.0–0.1)
Basophils Relative: 0.7 % (ref 0.0–3.0)
Eosinophils Absolute: 0.2 10*3/uL (ref 0.0–0.7)
Eosinophils Relative: 2 % (ref 0.0–5.0)
HCT: 44.9 % (ref 36.0–46.0)
Hemoglobin: 15.1 g/dL — ABNORMAL HIGH (ref 12.0–15.0)
Lymphocytes Relative: 30.9 % (ref 12.0–46.0)
Lymphs Abs: 2.8 10*3/uL (ref 0.7–4.0)
MCHC: 33.6 g/dL (ref 30.0–36.0)
MCV: 87.8 fl (ref 78.0–100.0)
Monocytes Absolute: 0.6 10*3/uL (ref 0.1–1.0)
Monocytes Relative: 6.9 % (ref 3.0–12.0)
Neutro Abs: 5.3 10*3/uL (ref 1.4–7.7)
Neutrophils Relative %: 59.5 % (ref 43.0–77.0)
Platelets: 212 10*3/uL (ref 150.0–400.0)
RBC: 5.12 Mil/uL — ABNORMAL HIGH (ref 3.87–5.11)
RDW: 13.5 % (ref 11.5–15.5)
WBC: 9 10*3/uL (ref 4.0–10.5)

## 2022-09-12 LAB — TSH: TSH: 2.05 u[IU]/mL (ref 0.35–5.50)

## 2022-09-12 LAB — HCG, QUANTITATIVE, PREGNANCY: Quantitative HCG: <0.6 m[IU]/mL

## 2022-09-12 MED ORDER — ATOMOXETINE HCL 25 MG PO CAPS
25.0000 mg | ORAL_CAPSULE | Freq: Every day | ORAL | 0 refills | Status: DC
  Filled 2022-09-12: qty 30, 30d supply, fill #0

## 2022-09-12 NOTE — Progress Notes (Signed)
Jill Mccann is a 21 y.o. female here for a follow up of a pre-existing problem.  History of Present Illness:   Chief Complaint  Patient presents with   Hypertension    Pt has been checking blood pressure, yesterday 136/87, at night 120/75.    HPI  Hypertension Patient is complaint with her 50-12.5 mg Hyzaar medication. She reports that she regularly checks her blood pressure at home. Before taking the medication yesterday, she explains her reading was around 136/87 and later that night it was 120/74. She states that her readings aren't usually around a certain number, but always fluctuate. Patient reports that she is not experiencing any adverse side effects with her medicine, but does confirm it does make her pee more frequently.   ADHD; Palpitations Patient reports that she is complaint with her 60 mg Strattera medication, but states that it may be causing her pulse to drastically increase. She explains that her pulse was 125 when watching tv and reports that it doesn't get that high unless she is exercising. Patient reports that she cannot tell if the medication is working, but explains that it is better than nothing. Denies: chest pain, SOB, n/v/d, LE swelling.   Past Medical History:  Diagnosis Date   ADHD    Anxiety    Depression    Eczema    PCOS (polycystic ovarian syndrome)      Social History   Tobacco Use   Smoking status: Never   Smokeless tobacco: Never  Substance Use Topics   Alcohol use: No   Drug use: No    Past Surgical History:  Procedure Laterality Date   nexplanon  02/21/2021   Left ARM   TONSILLECTOMY      Family History  Problem Relation Age of Onset   Arthritis Mother    Depression Mother    Hypertension Mother    Miscarriages / [place] Mother    Arthritis Father    Diabetes Father    Heart disease Father    Hyperlipidemia Father    Hypertension Father    Asthma Sister    Stroke Maternal Grandmother    Alcohol abuse Maternal  Grandfather    Early death Maternal Grandfather    Hearing loss Maternal Grandfather    Hyperlipidemia Maternal Grandfather    Arthritis Paternal Grandmother    Diabetes Paternal Grandfather    Hypertension Paternal Grandfather    Kidney disease Paternal Grandfather     Allergies  Allergen Reactions   Justicia Adhatoda ([place]abar Nut Tree) [Justicia Adhatoda] Anaphylaxis   Other Anaphylaxis    Pecans   Hydrocodone Nausea And Vomiting    Current Medications:   Current Outpatient Medications:    atomoxetine (STRATTERA) 25 MG capsule, Take 1 capsule (25 mg total) by mouth daily., Disp: 30 capsule, Rfl: 0   etonogestrel (NEXPLANON) 68 MG IMPL implant, Nexplanon 68 mg subdermal implant  Inject 1 implant by subcutaneous route., Disp: , Rfl:    hydrOXYzine (ATARAX/VISTARIL) 25 MG tablet, TAKE 1 TAB 30-60 MINS PRIOR TO BEDTIME AS NEEDED FOR INSOMNIA/ANXIETY. MAY INCREASE TO 2 TABS (Patient not taking: Reported on 08/13/2022), Disp: 180 tablet, Rfl: 1   losartan-hydrochlorothiazide (HYZAAR) 50-12.5 MG tablet, Take 1 tablet by mouth daily., Disp: 90 tablet, Rfl: 1   metFORMIN (GLUCOPHAGE) 500 MG tablet, Take 1 tablet (500 mg total) by mouth 2 (two) times daily with a meal., Disp: 90 tablet, Rfl: 1   Review of Systems:   ROS Negative unless otherwise specified per HPI.  Vitals:  Vitals:   09/12/22 0911  BP: 128/86  Pulse: (!) 125  Temp: 98 F (36.7 C)  TempSrc: Temporal  SpO2: 98%  Weight: 282 lb 8 oz (128.1 kg)  Height: 5\' 4"  (1.626 m)     Body mass index is 48.49 kg/m.  Physical Exam:   Physical Exam Vitals and nursing note reviewed.  Constitutional:      General: She is not in acute distress.    Appearance: Normal appearance. She is well-developed. She is not ill-appearing or toxic-appearing.  HENT:     Head: Normocephalic and atraumatic.     Right Ear: External ear normal.     Left Ear: External ear normal.  Eyes:     Extraocular Movements: Extraocular movements  intact.     Pupils: Pupils are equal, round, and reactive to light.  Cardiovascular:     Rate and Rhythm: Regular rhythm. Tachycardia present.     Pulses: Normal pulses.     Heart sounds: Normal heart sounds, S1 normal and S2 normal. No murmur heard.    No gallop.  Pulmonary:     Effort: Pulmonary effort is normal. No respiratory distress.     Breath sounds: Normal breath sounds. No wheezing or rales.  Skin:    General: Skin is warm and dry.  Neurological:     Mental Status: She is alert and oriented to person, place, and time.     GCS: GCS eye subscore is 4. GCS verbal subscore is 5. GCS motor subscore is 6.  Psychiatric:        Speech: Speech normal.        Behavior: Behavior normal. Behavior is cooperative.        Judgment: Judgment normal.     Assessment and Plan:   Primary hypertension Normotensive Continue hyzaar 50-12.5 mg daily Continue to monitor at home Follow-up in 3 months  Palpitations EKG tracing is personally reviewed.  EKG notes tachycardia.  No acute changes.  Will check blood work and pregnancy test Decrease strattera to 25 mg daily Reduce caffeine  If tachycardia persists or new sx develop, recommend that she message and we will likely order zio patch Consider cardiology referral  Need for vaccination for meningococcus Completed today  Attention deficit hyperactivity disorder (ADHD), combined type Overall controlled Will decrease strattera to 25 mg due to tachycardia Follow-up in 3 months, sooner if concerns  I,Verona Buck,acting as a scribe for [place], PA.,have documented all relevant documentation on the behalf of [employer], PA,as directed by  Jarold Motto, PA while in the presence of Jarold Motto, Jarold Motto.  I, [place], Jarold Motto, have reviewed all documentation for this visit. The documentation on 09/12/22 for the exam, diagnosis, procedures, and orders are all accurate and complete.  09/14/22, PA-C

## 2022-09-12 NOTE — Patient Instructions (Signed)
It was great to see you!  Decrease your strattera to 25 mg daily Reduce caffeine intake Send me a mychart message in 1-2 weeks on how you are feeling  If palpitations do not improve or if you develop symptoms (shortness of breath), please call us and we will order additional testing.  Let's follow-up in 3 months, sooner if you have concerns.  Take care,  Jarold Motto PA-C

## 2022-09-17 ENCOUNTER — Other Ambulatory Visit (HOSPITAL_BASED_OUTPATIENT_CLINIC_OR_DEPARTMENT_OTHER): Payer: Self-pay

## 2022-10-24 ENCOUNTER — Other Ambulatory Visit: Payer: Self-pay | Admitting: Physician Assistant

## 2022-10-24 ENCOUNTER — Other Ambulatory Visit: Payer: Self-pay

## 2022-10-24 ENCOUNTER — Other Ambulatory Visit (HOSPITAL_BASED_OUTPATIENT_CLINIC_OR_DEPARTMENT_OTHER): Payer: Self-pay

## 2022-10-24 MED ORDER — ATOMOXETINE HCL 25 MG PO CAPS
25.0000 mg | ORAL_CAPSULE | Freq: Every day | ORAL | 1 refills | Status: DC
  Filled 2022-10-24: qty 30, 30d supply, fill #0
  Filled 2022-12-19: qty 30, 30d supply, fill #1

## 2022-12-06 ENCOUNTER — Encounter: Payer: 59 | Admitting: Physician Assistant

## 2022-12-26 NOTE — Progress Notes (Signed)
Subjective:    Jill Mccann is a 22 y.o. female and is here for a comprehensive physical exam.  HPI  Health Maintenance Due  Topic Date Due   CHLAMYDIA SCREENING  Never done   Hepatitis C Screening  Never done    Acute Concerns: None  Chronic Issues: PCOS; BMI 51.88 Treated with Glucophage 500 mg twice daily with a meal. Feels like the Glucophage is causing chest tightness at times.  When she stopped taking the Glucophage, she had no chest tightness. She is interested in starting GLP-1 for weight loss. Denies family h/o thyroid cancer. Nexplanon implant in place.  ADHD Treated with Strattera 25 mg daily.  Reports Strattera is not relieving symptoms. Having chest tightness recently. Concerned that Glucophage or Strattera is contributing.  Hypertension Treated with losartan-hydrochlorothiazide 50-12.5 mg daily. Blood pressure elevated at 140/100 today. Measuring blood pressure at home. Reports blood pressure at 124/85 on 12/05/22. Denies unusual headaches. Denies swelling in legs/ankles.   Health Maintenance: Immunizations -- UTD on flu, tetanus vaccines.  Colonoscopy -- N/A Mammogram -- N/A PAP -- Defer to gynecologist. Bone Density -- N/A Diet -- Food intake fluctuates significantly day to day. Exercise -- Not exercising. Lacking motivation.  Sleep habits -- Good. Mood -- Stable. Lacking energy.  UTD with dentist? - UTD UTD with eye doctor? - UTD  Weight history: Wt Readings from Last 10 Encounters:  01/02/23 (!) 302 lb 4 oz (137.1 kg)  09/12/22 282 lb 8 oz (128.1 kg)  08/13/22 289 lb (131.1 kg)  05/21/22 293 lb (132.9 kg)  04/20/22 295 lb 6 oz (134 kg)  04/19/22 295 lb (133.8 kg)  02/09/22 298 lb (135.2 kg)  11/08/21 293 lb 6.1 oz (133.1 kg)  10/03/21 299 lb 6.4 oz (135.8 kg)  11/22/20 230 lb (104.3 kg) (99 %, Z= 2.31)*   * Growth percentiles are based on CDC (Girls, 2-20 Years) data.   Body mass index is 51.88 kg/m. No LMP recorded. Patient has  had an implant.  Alcohol use:  reports current alcohol use.  Tobacco use:  Tobacco Use: Low Risk  (01/02/2023)   Patient History    Smoking Tobacco Use: Never    Smokeless Tobacco Use: Never    Passive Exposure: Not on file   Eligible for lung cancer screening? No     01/02/2023    8:37 AM  Depression screen PHQ 2/9  Decreased Interest 3  Down, Depressed, Hopeless 2  PHQ - 2 Score 5  Altered sleeping 3  Tired, decreased energy 3  Change in appetite 3  Feeling bad or failure about yourself  1  Trouble concentrating 3  Moving slowly or fidgety/restless 3  Suicidal thoughts 1  PHQ-9 Score 22  Difficult doing work/chores Extremely dIfficult     Other providers/specialists: Patient Care Team: [name], [place] as PCP - General (Physician Assistant) Kerry Dory, NP as Nurse Practitioner (Radiology)    PMHx, SurgHx, SocialHx, Medications, and Allergies were reviewed in the Visit Navigator and updated as appropriate.   Past Medical History:  Diagnosis Date   ADHD    Anxiety    Depression    Eczema    PCOS (polycystic ovarian syndrome)      Past Surgical History:  Procedure Laterality Date   nexplanon  02/21/2021   Left ARM   TONSILLECTOMY       Family History  Problem Relation Age of Onset   Arthritis Mother    Depression Mother    Hypertension Mother  Miscarriages / [place] Mother    Arthritis Father    Diabetes Father    Heart disease Father    Hyperlipidemia Father    Hypertension Father    Asthma Sister    Stroke Maternal Grandmother    Alzheimer's disease Maternal Grandmother    Alcohol abuse Maternal Grandfather    Early death Maternal Grandfather    Hearing loss Maternal Grandfather    Hyperlipidemia Maternal Grandfather    Arthritis Paternal Grandmother    Diabetes Paternal Grandfather    Hypertension Paternal Grandfather    Kidney disease Paternal Grandfather     Social History   Tobacco Use   Smoking status: Never    Smokeless tobacco: Never  Substance Use Topics   Alcohol use: Yes    Comment: 2 per week   Drug use: No    Review of Systems:   Review of Systems  Constitutional:  Negative for chills, fever, malaise/fatigue and weight loss.  HENT:  Negative for hearing loss, sinus pain and sore throat.   Respiratory:  Negative for cough and hemoptysis.   Cardiovascular:  Positive for chest pain (Tightness). Negative for palpitations, leg swelling and PND.  Gastrointestinal:  Negative for abdominal pain, blood in stool, constipation, diarrhea, heartburn, nausea and vomiting.  Genitourinary:  Negative for dysuria, frequency and urgency.  Musculoskeletal:  Negative for back pain, myalgias and neck pain.  Skin:  Negative for itching and rash.  Neurological:  Negative for dizziness, tingling, seizures and headaches.  Endo/Heme/Allergies:  Negative for polydipsia.  Psychiatric/Behavioral:  Negative for depression. The patient is not nervous/anxious.     Objective:   BP 130/86 (BP Location: Right Arm, Patient Position: Sitting, Cuff Size: Large)   Pulse (!) 119   Temp 98 F (36.7 C) (Temporal)   Ht 5\' 4"  (1.626 m)   Wt (!) 302 lb 4 oz (137.1 kg)   SpO2 99%   BMI 51.88 kg/m  Body mass index is 51.88 kg/m.   General Appearance:    Alert, cooperative, no distress, appears stated age  Head:    Normocephalic, without obvious abnormality, atraumatic  Eyes:    PERRL, conjunctiva/corneas clear, EOM's intact, fundi    benign, both eyes  Ears:    Normal TM's and external ear canals, both ears  Nose:   Nares normal, septum midline, mucosa normal, no drainage    or sinus tenderness  Throat:   Lips, mucosa, and tongue normal; teeth and gums normal  Neck:   Supple, symmetrical, trachea midline, no adenopathy;    thyroid:  no enlargement/tenderness/nodules; no carotid   bruit or JVD  Back:     Symmetric, no curvature, ROM normal, no CVA tenderness  Lungs:     Clear to auscultation bilaterally,  respirations unlabored  Chest Wall:    No tenderness or deformity   Heart:    Regular rate and rhythm, S1 and S2 normal, no murmur, rub or gallop  Breast Exam:    Deferred  Abdomen:     Soft, non-tender, bowel sounds active all four quadrants,    no masses, no organomegaly  Genitalia:    Deferred   Extremities:   Extremities normal, atraumatic, no cyanosis or edema  Pulses:   2+ and symmetric all extremities  Skin:   Skin color, texture, turgor normal, no rashes or lesions  Lymph nodes:   Cervical, supraclavicular, and axillary nodes normal  Neurologic:   CNII-XII intact, normal strength, sensation and reflexes    throughout    Assessment/Plan:  Routine physical examination Today patient counseled on age appropriate routine health concerns for screening and prevention, each reviewed and up to date or declined. Immunizations reviewed and up to date or declined. Labs ordered and reviewed. Risk factors for depression reviewed and negative. Hearing function and visual acuity are intact. ADLs screened and addressed as needed. Functional ability and level of safety reviewed and appropriate. Education, counseling and referrals performed based on assessed risks today. Patient provided with a copy of personalized plan for preventive services.  Primary hypertension Normotensive on recheck Continue losartan-hctz 50-12.5 mg daily Follow-up in 6 months, sooner if concerns Continue to monitor home BP  Obesity, unspecified classification, unspecified obesity type, unspecified whether serious comorbidity present; PCOS (polycystic ovarian syndrome) She is interested in GLP-1 Stop metformin - not tolerating She is planning to start exercising again and eating healthy We are also going to add on Wellbutrin for her depression/ADHD  Attention deficit hyperactivity disorder (ADHD), combined type Uncontrolled Stop [employer] Wellbutrin 150 mg daily Follow-up in 1 month, sooner if  concerns  Moderate episode of recurrent major depressive disorder (HCC) Uncontrolled Start Wellbutrin 150 mg daily Follow-up in 1 month, sooner if concerns I discussed with patient that if they develop any SI, to tell someone immediately and seek medical attention. Denies SI/HI  Encounter for screening for other viral diseases Update Hep C  I,Alexander Ruley,acting as a scribe for [employer], PA.,have documented all relevant documentation on the behalf of [name], PA,as directed by  [name], PA while in the presence of [name], [place].  I, [name], [place], have reviewed all documentation for this visit. The documentation on 01/02/23 for the exam, diagnosis, procedures, and orders are all accurate and complete.   [name], PA-C [place]

## 2023-01-02 ENCOUNTER — Encounter: Payer: Self-pay | Admitting: Physician Assistant

## 2023-01-02 ENCOUNTER — Ambulatory Visit (INDEPENDENT_AMBULATORY_CARE_PROVIDER_SITE_OTHER): Payer: 59 | Admitting: Physician Assistant

## 2023-01-02 ENCOUNTER — Other Ambulatory Visit (HOSPITAL_BASED_OUTPATIENT_CLINIC_OR_DEPARTMENT_OTHER): Payer: Self-pay

## 2023-01-02 VITALS — BP 130/86 | HR 119 | Temp 98.0°F | Ht 64.0 in | Wt 302.2 lb

## 2023-01-02 DIAGNOSIS — F902 Attention-deficit hyperactivity disorder, combined type: Secondary | ICD-10-CM | POA: Diagnosis not present

## 2023-01-02 DIAGNOSIS — Z1159 Encounter for screening for other viral diseases: Secondary | ICD-10-CM | POA: Diagnosis not present

## 2023-01-02 DIAGNOSIS — E669 Obesity, unspecified: Secondary | ICD-10-CM

## 2023-01-02 DIAGNOSIS — F331 Major depressive disorder, recurrent, moderate: Secondary | ICD-10-CM

## 2023-01-02 DIAGNOSIS — I10 Essential (primary) hypertension: Secondary | ICD-10-CM

## 2023-01-02 DIAGNOSIS — Z0000 Encounter for general adult medical examination without abnormal findings: Secondary | ICD-10-CM | POA: Diagnosis not present

## 2023-01-02 DIAGNOSIS — E282 Polycystic ovarian syndrome: Secondary | ICD-10-CM | POA: Diagnosis not present

## 2023-01-02 LAB — LIPID PANEL
Cholesterol: 204 mg/dL — ABNORMAL HIGH (ref 0–200)
HDL: 47.2 mg/dL (ref >39.00)
LDL Cholesterol: 137 mg/dL — ABNORMAL HIGH (ref 0–99)
NonHDL: 156.32
Total CHOL/HDL Ratio: 4
Triglycerides: 95 mg/dL (ref 0.0–149.0)
VLDL: 19 mg/dL (ref 0.0–40.0)

## 2023-01-02 LAB — CBC WITH DIFFERENTIAL/PLATELET
Basophils Absolute: 0.1 10*3/uL (ref 0.0–0.1)
Basophils Relative: 0.8 % (ref 0.0–3.0)
Eosinophils Absolute: 0.2 10*3/uL (ref 0.0–0.7)
Eosinophils Relative: 2 % (ref 0.0–5.0)
HCT: 43.6 % (ref 36.0–46.0)
Hemoglobin: 14.6 g/dL (ref 12.0–15.0)
Lymphocytes Relative: 32.6 % (ref 12.0–46.0)
Lymphs Abs: 2.6 10*3/uL (ref 0.7–4.0)
MCHC: 33.4 g/dL (ref 30.0–36.0)
MCV: 88.3 fl (ref 78.0–100.0)
Monocytes Absolute: 0.5 10*3/uL (ref 0.1–1.0)
Monocytes Relative: 6.7 % (ref 3.0–12.0)
Neutro Abs: 4.7 10*3/uL (ref 1.4–7.7)
Neutrophils Relative %: 57.9 % (ref 43.0–77.0)
Platelets: 205 10*3/uL (ref 150.0–400.0)
RBC: 4.94 Mil/uL (ref 3.87–5.11)
RDW: 13.5 % (ref 11.5–15.5)
WBC: 8.1 10*3/uL (ref 4.0–10.5)

## 2023-01-02 LAB — COMPREHENSIVE METABOLIC PANEL
ALT: 23 U/L (ref 0–35)
AST: 17 U/L (ref 0–37)
Albumin: 4 g/dL (ref 3.5–5.2)
Alkaline Phosphatase: 70 U/L (ref 39–117)
BUN: 14 mg/dL (ref 6–23)
CO2: 28 mEq/L (ref 19–32)
Calcium: 9.4 mg/dL (ref 8.4–10.5)
Chloride: 104 mEq/L (ref 96–112)
Creatinine, Ser: 0.92 mg/dL (ref 0.40–1.20)
GFR: 89.01 mL/min (ref >60.00)
Glucose, Bld: 97 mg/dL (ref 70–99)
Potassium: 4.3 mEq/L (ref 3.5–5.1)
Sodium: 141 mEq/L (ref 135–145)
Total Bilirubin: 0.3 mg/dL (ref 0.2–1.2)
Total Protein: 6.8 g/dL (ref 6.0–8.3)

## 2023-01-02 LAB — HEMOGLOBIN A1C: Hgb A1c MFr Bld: 5.5 % (ref 4.6–6.5)

## 2023-01-02 MED ORDER — BUPROPION HCL ER (XL) 150 MG PO TB24
150.0000 mg | ORAL_TABLET | Freq: Every day | ORAL | 1 refills | Status: DC
  Filled 2023-01-02: qty 30, 30d supply, fill #0
  Filled 2023-02-04: qty 30, 30d supply, fill #1

## 2023-01-02 MED ORDER — WEGOVY 0.25 MG/0.5ML ~~LOC~~ SOAJ
0.2500 mg | SUBCUTANEOUS | 1 refills | Status: DC
  Filled 2023-01-02 – 2023-03-13 (×4): qty 2, 28d supply, fill #0

## 2023-01-02 NOTE — Patient Instructions (Addendum)
It was great to see you!  -Start 150 mg Wellbutrin  -Stop metformin and strattera Talk therapy referral  I'm going to send in [place] or Zepbound  Follow-up in 1 month  If you develop suicidal thoughts, please tell someone and immediately proceed to our local 24/7 crisis center, [facility]. [address], [place], [place] [address] [phone].   Please go to the lab for blood work.   Our office will call you with your results unless you have chosen to receive results via MyChart.  If your blood work is normal we will follow-up each year for physicals and as scheduled for chronic medical problems.  If anything is abnormal we will treat accordingly and get you in for a follow-up.  Take care,  Aldona Bar

## 2023-01-03 LAB — HEPATITIS C ANTIBODY: Hepatitis C Ab: NONREACTIVE

## 2023-01-05 ENCOUNTER — Other Ambulatory Visit (HOSPITAL_BASED_OUTPATIENT_CLINIC_OR_DEPARTMENT_OTHER): Payer: Self-pay

## 2023-01-07 ENCOUNTER — Other Ambulatory Visit (HOSPITAL_BASED_OUTPATIENT_CLINIC_OR_DEPARTMENT_OTHER): Payer: Self-pay

## 2023-01-07 ENCOUNTER — Telehealth: Payer: Self-pay | Admitting: *Deleted

## 2023-01-07 ENCOUNTER — Encounter (HOSPITAL_BASED_OUTPATIENT_CLINIC_OR_DEPARTMENT_OTHER): Payer: Self-pay

## 2023-01-07 NOTE — Telephone Encounter (Signed)
PA needed for Wegovy 0.25 mg. PA done thru Covermymeds. Awaiting determination. KEY: [id number]

## 2023-01-10 ENCOUNTER — Other Ambulatory Visit (HOSPITAL_BASED_OUTPATIENT_CLINIC_OR_DEPARTMENT_OTHER): Payer: Self-pay

## 2023-01-10 NOTE — Telephone Encounter (Signed)
Spoke to pt told her received fax from her insurance PA for Mancel Parsons has been denied due to Drug not covered Plan exclusion. Pt verbalized understanding.

## 2023-01-10 NOTE — Telephone Encounter (Signed)
Received response from [employer]. PA for Wegovy 0.25 mg has been denied due to Drug not Covered/Plan Exclusion.

## 2023-01-18 ENCOUNTER — Other Ambulatory Visit (HOSPITAL_BASED_OUTPATIENT_CLINIC_OR_DEPARTMENT_OTHER): Payer: Self-pay

## 2023-01-23 ENCOUNTER — Other Ambulatory Visit (HOSPITAL_BASED_OUTPATIENT_CLINIC_OR_DEPARTMENT_OTHER): Payer: Self-pay

## 2023-01-24 ENCOUNTER — Other Ambulatory Visit (HOSPITAL_BASED_OUTPATIENT_CLINIC_OR_DEPARTMENT_OTHER): Payer: Self-pay

## 2023-01-26 ENCOUNTER — Other Ambulatory Visit (HOSPITAL_BASED_OUTPATIENT_CLINIC_OR_DEPARTMENT_OTHER): Payer: Self-pay

## 2023-01-28 ENCOUNTER — Encounter (HOSPITAL_BASED_OUTPATIENT_CLINIC_OR_DEPARTMENT_OTHER): Payer: Self-pay

## 2023-01-28 ENCOUNTER — Other Ambulatory Visit (HOSPITAL_BASED_OUTPATIENT_CLINIC_OR_DEPARTMENT_OTHER): Payer: Self-pay

## 2023-01-30 ENCOUNTER — Other Ambulatory Visit (HOSPITAL_BASED_OUTPATIENT_CLINIC_OR_DEPARTMENT_OTHER): Payer: Self-pay

## 2023-01-31 ENCOUNTER — Other Ambulatory Visit (HOSPITAL_BASED_OUTPATIENT_CLINIC_OR_DEPARTMENT_OTHER): Payer: Self-pay

## 2023-02-01 ENCOUNTER — Encounter: Payer: Self-pay | Admitting: Physician Assistant

## 2023-02-04 ENCOUNTER — Other Ambulatory Visit (HOSPITAL_BASED_OUTPATIENT_CLINIC_OR_DEPARTMENT_OTHER): Payer: Self-pay

## 2023-02-06 ENCOUNTER — Other Ambulatory Visit: Payer: Self-pay

## 2023-02-08 ENCOUNTER — Other Ambulatory Visit (HOSPITAL_BASED_OUTPATIENT_CLINIC_OR_DEPARTMENT_OTHER): Payer: Self-pay

## 2023-02-08 ENCOUNTER — Telehealth: Payer: Self-pay | Admitting: *Deleted

## 2023-02-08 ENCOUNTER — Ambulatory Visit: Payer: 59 | Admitting: Physician Assistant

## 2023-02-08 ENCOUNTER — Encounter: Payer: Self-pay | Admitting: Physician Assistant

## 2023-02-08 VITALS — BP 140/90 | HR 89 | Temp 97.8°F | Ht 64.0 in | Wt 306.0 lb

## 2023-02-08 DIAGNOSIS — I10 Essential (primary) hypertension: Secondary | ICD-10-CM

## 2023-02-08 DIAGNOSIS — F331 Major depressive disorder, recurrent, moderate: Secondary | ICD-10-CM

## 2023-02-08 MED ORDER — BUPROPION HCL ER (XL) 150 MG PO TB24
150.0000 mg | ORAL_TABLET | Freq: Every day | ORAL | 1 refills | Status: DC
  Filled 2023-02-08: qty 90, 90d supply, fill #0
  Filled 2023-03-13: qty 30, 30d supply, fill #0

## 2023-02-08 NOTE — Progress Notes (Signed)
Jill Mccann is a 22 y.o. female here for a follow up of a pre-existing problem.  History of Present Illness:   Chief Complaint  Patient presents with   Depression    HPI  Depression Patient reports thatshe has not been taking her 150 mg Wellbutrin consistently for the past few weeks because of recent health issues with her grandmother's boyfriend. She mentions that she does think that it is helping some. Patient states that she will begin to take medication regularly.  Denies worsening mood with medication. Denies SI/HI.  Elevated blood pressure Patient reports that is is compliant with losartan-hydrochlorothiazide 50-12.5  with no concerns. Denies chest pain, LE swelling Does not check bp regularly.    Past Medical History:  Diagnosis Date   ADHD    Anxiety    Depression    Eczema    PCOS (polycystic ovarian syndrome)      Social History   Tobacco Use   Smoking status: Never   Smokeless tobacco: Never  Substance Use Topics   Alcohol use: Yes    Comment: 2 per week   Drug use: No    Past Surgical History:  Procedure Laterality Date   nexplanon  02/21/2021   Left ARM   TONSILLECTOMY      Family History  Problem Relation Age of Onset   Arthritis Mother    Depression Mother    Hypertension Mother    Miscarriages / [place] Mother    Arthritis Father    Diabetes Father    Heart disease Father    Hyperlipidemia Father    Hypertension Father    Asthma Sister    Stroke Maternal Grandmother    Alzheimer's disease Maternal Grandmother    Alcohol abuse Maternal Grandfather    Early death Maternal Grandfather    Hearing loss Maternal Grandfather    Hyperlipidemia Maternal Grandfather    Arthritis Paternal Grandmother    Diabetes Paternal Grandfather    Hypertension Paternal Grandfather    Kidney disease Paternal Grandfather     Allergies  Allergen Reactions   Justicia Adhatoda (Malabar Nut Tree) [Justicia Adhatoda] Anaphylaxis   Other Anaphylaxis     Pecans   Hydrocodone Nausea And Vomiting    Current Medications:   Current Outpatient Medications:    buPROPion (WELLBUTRIN XL) 150 MG 24 hr tablet, Take 1 tablet (150 mg total) by mouth daily., Disp: 30 tablet, Rfl: 1   etonogestrel (NEXPLANON) 68 MG IMPL implant, Nexplanon 68 mg subdermal implant  Inject 1 implant by subcutaneous route., Disp: , Rfl:    hydrOXYzine (ATARAX/VISTARIL) 25 MG tablet, TAKE 1 TAB 30-60 MINS PRIOR TO BEDTIME AS NEEDED FOR INSOMNIA/ANXIETY. MAY INCREASE TO 2 TABS, Disp: 180 tablet, Rfl: 1   losartan-hydrochlorothiazide (HYZAAR) 50-12.5 MG tablet, Take 1 tablet by mouth daily., Disp: 90 tablet, Rfl: 1   Semaglutide-Weight Management (WEGOVY) 0.25 MG/0.5ML SOAJ, Inject 0.25 mg into the skin once a week., Disp: 2 mL, Rfl: 1   Review of Systems:   ROS Negative unless otherwise specified per HPI.  Vitals:   Vitals:   02/08/23 1104  BP: (!) 140/90  Pulse: 89  Temp: 97.8 F (36.6 C)  TempSrc: Temporal  SpO2: 95%  Weight: (!) 306 lb (138.8 kg)  Height:  (1.626 m)     Body mass index is 52.52 kg/m.  Physical Exam:   Physical Exam Constitutional:      General: She is not in acute distress.    Appearance: Normal appearance. She is not  ill-appearing.  HENT:     Head: Normocephalic and atraumatic.     Right Ear: External ear normal.     Left Ear: External ear normal.  Eyes:     Extraocular Movements: Extraocular movements intact.     Pupils: Pupils are equal, round, and reactive to light.  Cardiovascular:     Rate and Rhythm: Normal rate and regular rhythm.     Heart sounds: Normal heart sounds. No murmur heard.    No gallop.  Pulmonary:     Effort: Pulmonary effort is normal. No respiratory distress.     Breath sounds: Normal breath sounds. No wheezing or rales.  Skin:    General: Skin is warm and dry.  Neurological:     Mental Status: She is alert and oriented to person, place, and time.  Psychiatric:        Judgment: Judgment  normal.     Assessment and Plan:   Primary hypertension Above goal No evidence of end organ damage Continue losartan-hydrochlorothiazide 50-12.5 mg daily Recommend close monitoring of BP at home Follow-up if new/worsening   Moderate episode of recurrent major depressive disorder No red flags Recommend continue Wellbutrin 150 mg XL daily and take consistently Follow-up in 3 months, sooner if concerns No SI/HI on today's exam I discussed with patient that if they develop any SI, to tell someone immediately and seek medical attention.   I,Verona Buck,acting as a Neurosurgeon for [employer], PA.,have documented all relevant documentation on the behalf of Jarold Motto, PA,as directed by  Jarold Motto, PA while in the presence of Jarold Motto, [place].  I, Jarold Motto, [place], have reviewed all documentation for this visit. The documentation on 02/08/23 for the exam, diagnosis, procedures, and orders are all accurate and complete.  Jarold Motto, PA-C

## 2023-02-08 NOTE — Telephone Encounter (Signed)
Spoke to pt told her PA was done for [facility] back in March and I spoke to you on 3/18 to let you know it was denied due to drug plan exclusion. Told pt she can contact insurance to see if they will cover any other weight loss medications. Pt verbalized understanding.

## 2023-02-08 NOTE — Patient Instructions (Signed)
It was great to see you!  Continue Wellbutrin 150 mg consistently Follow-up in 3 months to discuss wellbutrin and HOPEFULLY to follow-up on your [facility] use!  If you do not hear anything about your Wegovy in 1-2 weeks -- let us know!  Take care,  Jarold Motto PA-C

## 2023-02-18 ENCOUNTER — Other Ambulatory Visit (HOSPITAL_COMMUNITY): Payer: Self-pay

## 2023-03-13 ENCOUNTER — Other Ambulatory Visit (HOSPITAL_BASED_OUTPATIENT_CLINIC_OR_DEPARTMENT_OTHER): Payer: Self-pay

## 2023-03-13 ENCOUNTER — Other Ambulatory Visit: Payer: Self-pay | Admitting: Physician Assistant

## 2023-03-13 ENCOUNTER — Other Ambulatory Visit: Payer: Self-pay

## 2023-03-13 MED ORDER — LOSARTAN POTASSIUM-HCTZ 50-12.5 MG PO TABS
1.0000 | ORAL_TABLET | Freq: Every day | ORAL | 1 refills | Status: DC
  Filled 2023-03-13: qty 30, 30d supply, fill #0
  Filled 2023-04-12: qty 30, 30d supply, fill #1
  Filled 2023-05-15: qty 30, 30d supply, fill #2
  Filled 2023-06-18: qty 30, 30d supply, fill #3
  Filled 2023-07-18: qty 30, 30d supply, fill #4
  Filled 2023-08-21: qty 30, 30d supply, fill #5

## 2023-03-20 ENCOUNTER — Other Ambulatory Visit (HOSPITAL_BASED_OUTPATIENT_CLINIC_OR_DEPARTMENT_OTHER): Payer: Self-pay

## 2023-04-23 ENCOUNTER — Encounter: Payer: Self-pay | Admitting: Radiology

## 2023-04-23 ENCOUNTER — Ambulatory Visit (INDEPENDENT_AMBULATORY_CARE_PROVIDER_SITE_OTHER): Payer: 59 | Admitting: Radiology

## 2023-04-23 ENCOUNTER — Other Ambulatory Visit (HOSPITAL_COMMUNITY)
Admission: RE | Admit: 2023-04-23 | Discharge: 2023-04-23 | Disposition: A | Discharge status: Home / Self Care | Payer: 59 | Source: Ambulatory Visit | Attending: Radiology | Admitting: Radiology

## 2023-04-23 VITALS — BP 122/84 | Ht 64.0 in | Wt 313.0 lb

## 2023-04-23 DIAGNOSIS — Z01419 Encounter for gynecological examination (general) (routine) without abnormal findings: Secondary | ICD-10-CM | POA: Diagnosis not present

## 2023-04-23 DIAGNOSIS — Z113 Encounter for screening for infections with a predominantly sexual mode of transmission: Secondary | ICD-10-CM | POA: Insufficient documentation

## 2023-04-23 NOTE — Progress Notes (Signed)
   Jill Mccann 07-01-2001 [id number]   History:  22 y.o. G0 presents for annual exam. No new concerns.   Gynecologic History No LMP recorded. Patient has had an implant.   Contraception/Family planning: Nexplanon 02/21/21 Sexually active: yes   Obstetric History OB History  Gravida Para Term Preterm AB Living  0 0 0 0 0 0  SAB IAB Ectopic Multiple Live Births  0 0 0 0 0     The following portions of the patient's history were reviewed and updated as appropriate: allergies, current medications, past family history, past medical history, past social history, past surgical history, and problem list.  Review of Systems Pertinent items noted in HPI and remainder of comprehensive ROS otherwise negative.   Past medical history, past surgical history, family history and social history were all reviewed and documented in the EPIC chart.   Exam:  Vitals:   04/23/23 0921  BP: 122/84  Weight: (!) 313 lb (142 kg)  Height: 5\' 4"  (1.626 m)   Body mass index is 53.73 kg/m.  General appearance:  Normal, obese Thyroid:  Symmetrical, normal in size, without palpable masses or nodularity. Respiratory  Auscultation:  Clear without wheezing or rhonchi Cardiovascular  Auscultation:  Regular rate, without rubs, murmurs or gallops  Edema/varicosities:  Not grossly evident Abdominal  Soft,nontender, without masses, guarding or rebound.  Liver/spleen:  No organomegaly noted  Hernia:  None appreciated  Skin  Inspection:  Grossly normal Breasts: Examined lying and sitting.   Right: Without masses, retractions, nipple discharge or axillary adenopathy.   Left: Without masses, retractions, nipple discharge or axillary adenopathy. Genitourinary   Inguinal/mons:  Normal without inguinal adenopathy  External genitalia:  Normal appearing vulva with no masses, tenderness, or lesions  BUS/Urethra/Skene's glands:  Normal without masses or exudate  Vagina:  Normal appearing with normal color and  discharge, no lesions  Cervix:  Normal appearing without discharge or lesions  Uterus:  Normal in size, shape and contour.  Mobile, nontender  Adnexa/parametria:     Rt: Normal in size, without masses or tenderness.   Lt: Normal in size, without masses or tenderness.  Anus and perineum: Normal   Raynelle Fanning, CMA present for exam  Assessment/Plan:   1. Well woman exam with routine gynecological exam - Cytology - PAP( [place]) - Consider options for [facility] IUD, another Nexplanon, POPs, before 02/2024 when Nexplanon expires  2. Screen for STD (sexually transmitted disease) - Cytology - PAP( [place])     Discussed SBE, colonoscopy and DEXA screening as directed/appropriate. Recommend of exercise weekly, including weight bearing exercise. Encouraged the use of seatbelts and sunscreen. Return in 1 year for annual or as needed.   Tanda Rockers WHNP-BC 9:38 AM 04/23/2023

## 2023-04-24 LAB — CYTOLOGY - PAP
Chlamydia: NEGATIVE
Comment: NEGATIVE
Comment: NEGATIVE
Comment: NORMAL
Diagnosis: NEGATIVE
Neisseria Gonorrhea: NEGATIVE
Trichomonas: NEGATIVE

## 2023-05-20 ENCOUNTER — Other Ambulatory Visit (HOSPITAL_BASED_OUTPATIENT_CLINIC_OR_DEPARTMENT_OTHER): Payer: Self-pay

## 2023-06-19 ENCOUNTER — Other Ambulatory Visit (HOSPITAL_BASED_OUTPATIENT_CLINIC_OR_DEPARTMENT_OTHER): Payer: Self-pay

## 2023-09-20 ENCOUNTER — Other Ambulatory Visit: Payer: Self-pay | Admitting: Physician Assistant

## 2023-09-21 ENCOUNTER — Other Ambulatory Visit (HOSPITAL_BASED_OUTPATIENT_CLINIC_OR_DEPARTMENT_OTHER): Payer: Self-pay

## 2023-09-23 ENCOUNTER — Other Ambulatory Visit (HOSPITAL_BASED_OUTPATIENT_CLINIC_OR_DEPARTMENT_OTHER): Payer: Self-pay

## 2023-09-23 MED ORDER — LOSARTAN POTASSIUM-HCTZ 50-12.5 MG PO TABS
1.0000 | ORAL_TABLET | Freq: Every day | ORAL | 0 refills | Status: DC
  Filled 2023-09-23: qty 30, 30d supply, fill #0
  Filled 2023-10-19: qty 30, 30d supply, fill #1
  Filled 2023-11-18: qty 30, 30d supply, fill #2

## 2023-11-23 ENCOUNTER — Other Ambulatory Visit (HOSPITAL_BASED_OUTPATIENT_CLINIC_OR_DEPARTMENT_OTHER): Payer: Self-pay

## 2023-12-03 ENCOUNTER — Ambulatory Visit (HOSPITAL_COMMUNITY)
Admission: RE | Admit: 2023-12-03 | Discharge: 2023-12-03 | Disposition: A | Discharge status: Home / Self Care | Payer: 59 | Source: Ambulatory Visit | Attending: Physician Assistant | Admitting: Physician Assistant

## 2023-12-03 ENCOUNTER — Encounter: Payer: Self-pay | Admitting: Physician Assistant

## 2023-12-03 ENCOUNTER — Ambulatory Visit (INDEPENDENT_AMBULATORY_CARE_PROVIDER_SITE_OTHER): Payer: 59 | Admitting: Physician Assistant

## 2023-12-03 ENCOUNTER — Other Ambulatory Visit (HOSPITAL_BASED_OUTPATIENT_CLINIC_OR_DEPARTMENT_OTHER): Payer: Self-pay

## 2023-12-03 VITALS — BP 138/100 | HR 102 | Temp 98.2°F | Ht 64.0 in | Wt 337.2 lb

## 2023-12-03 DIAGNOSIS — R2241 Localized swelling, mass and lump, right lower limb: Secondary | ICD-10-CM | POA: Insufficient documentation

## 2023-12-03 DIAGNOSIS — E669 Obesity, unspecified: Secondary | ICD-10-CM

## 2023-12-03 DIAGNOSIS — Z6841 Body Mass Index (BMI) 40.0 and over, adult: Secondary | ICD-10-CM | POA: Diagnosis not present

## 2023-12-03 DIAGNOSIS — Z23 Encounter for immunization: Secondary | ICD-10-CM | POA: Diagnosis not present

## 2023-12-03 DIAGNOSIS — I10 Essential (primary) hypertension: Secondary | ICD-10-CM

## 2023-12-03 DIAGNOSIS — E282 Polycystic ovarian syndrome: Secondary | ICD-10-CM | POA: Diagnosis not present

## 2023-12-03 MED ORDER — LOSARTAN POTASSIUM-HCTZ 100-25 MG PO TABS
1.0000 | ORAL_TABLET | Freq: Every day | ORAL | 1 refills | Status: DC
  Filled 2023-12-03: qty 30, 30d supply, fill #0
  Filled 2023-12-31: qty 30, 30d supply, fill #1

## 2023-12-03 MED ORDER — WEGOVY 0.25 MG/0.5ML ~~LOC~~ SOAJ
0.2500 mg | SUBCUTANEOUS | 1 refills | Status: DC
  Filled 2023-12-03: qty 2, 28d supply, fill #0

## 2023-12-03 NOTE — Patient Instructions (Signed)
It was great to see you!  Increase blood pressure medication to losartan-hydrochloroTHIAZIDE 100-25 mg  I will go ahead and place referral to Dr Gaynelle Adu with [facility] (go ahead and do the webinar)   I will re-send [facility] to see if this is now covered  Let's follow-up in 1-2 months for Comprehensive Physical Exam (CPE) preventive care annual visit and blood work, sooner if you have concerns.  Take care,  Jarold Motto PA-C

## 2023-12-03 NOTE — Progress Notes (Signed)
Jill Mccann is a 23 y.o. female here for a new problem.  History of Present Illness:   Chief Complaint  Patient presents with  . Obesity    Pt wants to discuss weight loss options.  . Edema    Pt c/o swelling in right ankle and foot x several months. Also swelling in lower extremities at times.  . Hypertension    Pt has been checking at home and has been okay.    Obesity  She complains of difficulty losing weight despite actively attempting too. She has actually gained weight despite exercising and watching her diet. She reports being diagnosed with PCOS and has been exhibiting symptoms such as abnormal hair growth and hair thinning. Reports that her mother has undergone a gastric bypass before and has had successful results with it.  She is interested in a consultation for a gastric sleeve procedure.   Previous insurance did not cover any weight loss injectables. Interested in trying them out if current insurance covers it.   Edema  She complains of swelling in her lower extremities, worst in her right ankle and foot. Swelling has persisted for the past several months.  Expresses concerns of a possible blot clot.  She does report a history of cellulitis in her right leg.  She does report standing for long period of time due to the nature of her work.   She does report being on Nexplanon 68 mg for the past several years.  She plans to have implant removed and returned to birth control pills due to possible side effects of blood clots.  Sh does report a family history of blood clots and wants to minimize chances of having one.    Elevated BP  BP is elevated today. 138/100. She does however states that her BP at home are within normal ranges.  Reports compliance and good tolerance of Hyzaar 50-12.5 mg but is wondering if she needs an increased dose as she occasionally feels flushed.      Past Medical History:  Diagnosis Date  . ADHD   . Anxiety   . Depression   . Eczema    . PCOS (polycystic ovarian syndrome)      Social History   Tobacco Use  . Smoking status: Never    Passive exposure: Never  . Smokeless tobacco: Never  Substance Use Topics  . Alcohol use: Yes    Comment: socially  . Drug use: No    Past Surgical History:  Procedure Laterality Date  . nexplanon  02/21/2021   Left ARM  . TONSILLECTOMY      Family History  Problem Relation Age of Onset  . Arthritis Mother   . Depression Mother   . Hypertension Mother   . Miscarriages / [place] Mother   . Arthritis Father   . Diabetes Father   . Heart disease Father   . Hyperlipidemia Father   . Hypertension Father   . Asthma Sister   . Stroke Maternal Grandmother   . Alzheimer's disease Maternal Grandmother   . Alcohol abuse Maternal Grandfather   . Early death Maternal Grandfather   . Hearing loss Maternal Grandfather   . Hyperlipidemia Maternal Grandfather   . Arthritis Paternal Grandmother   . Diabetes Paternal Grandfather   . Hypertension Paternal Grandfather   . Kidney disease Paternal Grandfather     Allergies  Allergen Reactions  . Justicia Adhatoda (Malabar Nut Tree) [Justicia Adhatoda] Anaphylaxis  . Other Anaphylaxis    Pecans  . Hydrocodone  Nausea And Vomiting    Current Medications:   Current Outpatient Medications:  .  etonogestrel (NEXPLANON) 68 MG IMPL implant, Inserted 02/21/21 left arm, Disp: , Rfl:  .  losartan-hydrochlorothiazide (HYZAAR) 100-25 MG tablet, Take 1 tablet by mouth daily., Disp: 90 tablet, Rfl: 1 .  Semaglutide-Weight Management (WEGOVY) 0.25 MG/0.5ML SOAJ, Inject 0.25 mg into the skin once a week., Disp: 2 mL, Rfl: 1   Review of Systems:   Review of Systems  Cardiovascular:  Positive for leg swelling.    Vitals:   Vitals:   12/03/23 1019  BP: (!) 138/100  Pulse: (!) 102  Temp: 98.2 F (36.8 C)  TempSrc: Temporal  SpO2: 98%  Weight: (!) 337 lb 4 oz (153 kg)  Height: 5\' 4"  (1.626 m)     Body mass index is 57.89  kg/m.  Physical Exam:   Physical Exam Vitals and nursing note reviewed.  Constitutional:      General: She is not in acute distress.    Appearance: She is well-developed. She is not ill-appearing or toxic-appearing.  Cardiovascular:     Rate and Rhythm: Normal rate and regular rhythm.     Pulses: Normal pulses.          Dorsalis pedis pulses are 2+ on the right side.       Posterior tibial pulses are 2+ on the right side.     Heart sounds: Normal heart sounds, S1 normal and S2 normal.  Pulmonary:     Effort: Pulmonary effort is normal.     Breath sounds: Normal breath sounds.  Musculoskeletal:     Right lower leg: 1+ Edema present.     Comments: Right calf 55 cm; left calf 54 cm No tenderness to palpation or erythema   Skin:    General: Skin is warm and dry.  Neurological:     Mental Status: She is alert.     GCS: GCS eye subscore is 4. GCS verbal subscore is 5. GCS motor subscore is 6.  Psychiatric:        Speech: Speech normal.        Behavior: Behavior normal. Behavior is cooperative.     Assessment and Plan:   Obesity, unspecified class, unspecified obesity type, unspecified whether serious comorbidity present Will trial sending in Wegovy - this was previously not covered by insurance -- she is aware of risks and benefits/side effect(s) Referral for gastric sleeve consultation Continue efforts at healthy lifestyle  Localized swelling of right lower leg She is requesting ultrasound due to persistent swelling Due to hormonal use, elevated blood pressure/heart rate, history of pitting edema in this leg -- will order ultrasound to rule out deep vein thrombosis  She was instructed that if she develops any chest pain or shortness of breath in the meantime to go to the emergency room   PCOS (polycystic ovarian syndrome) Reviewed Continue efforts at healthy lifestyle Recommend close follow-up with gynecology about contraceptions  Primary hypertension Above goal  today No evidence of end-organ damage on my exam Recommend patient monitor home blood pressure at least a few times weekly Increase prescription to losartan-hydrochloroTHIAZIDE 100-25 mg  If home monitoring shows consistent elevation, or any symptom(s) develop, recommend reach out to [place] for further advice on next steps Due for Comprehensive Physical Exam (CPE) preventive care annual visit in 4-6 weeks, recommend scheduling and we will follow up on this at that time   Jarold Motto, PA-C  I,Safa M Kadhim,acting as a Neurosurgeon for [employer],  PA.,have documented all relevant documentation on the behalf of Jarold Motto, PA,as directed by  Jarold Motto, PA while in the presence of Jarold Motto, [place].   I, Jarold Motto, [place], have reviewed all documentation for this visit. The documentation on 12/03/23 for the exam, diagnosis, procedures, and orders are all accurate and complete.

## 2023-12-06 ENCOUNTER — Telehealth: Payer: Self-pay

## 2023-12-06 NOTE — Telephone Encounter (Signed)
Pharmacy Patient Advocate Encounter   Received notification from CoverMyMeds that prior authorization for Wegovy 0.25MG /0.5ML auto-injectors is required/requested.   Insurance verification completed.   The patient is insured through [facility] .   Per test claim: PA required; PA started via CoverMyMeds. KEY [id number] . Waiting for clinical questions to populate.

## 2023-12-10 ENCOUNTER — Telehealth: Payer: Self-pay | Admitting: *Deleted

## 2023-12-10 ENCOUNTER — Other Ambulatory Visit (HOSPITAL_BASED_OUTPATIENT_CLINIC_OR_DEPARTMENT_OTHER): Payer: Self-pay

## 2023-12-10 NOTE — Telephone Encounter (Signed)
Copied from CRM [id number]. Topic: Clinical - Medication Question >> Dec 10, 2023  1:57 PM Adaysia C wrote: Reason for CRM: The patient would like to know if there are any other alternative medications that the provider can prescribe since the [facility] was denied. Please follow up with patient, if she does not answer she has requested a voicemail [phone]

## 2023-12-10 NOTE — Telephone Encounter (Signed)
Pharmacy Patient Advocate Encounter  Received notification from [facility] that Prior Authorization for [facility] has been DENIED.  Full denial letter will be uploaded to the media tab. See denial reason below.    PA #/Case ID/Reference #: [id number]

## 2023-12-10 NOTE — Telephone Encounter (Signed)
Pt aware Wegovy denied.

## 2023-12-10 NOTE — Telephone Encounter (Signed)
 See message and advise

## 2023-12-10 NOTE — Telephone Encounter (Signed)
Left message on voicemail to call office. Jill Mccann has been denied.

## 2023-12-10 NOTE — Addendum Note (Signed)
Addended by: Jimmye Norman on: 12/10/2023 01:34 PM   Modules accepted: Orders

## 2023-12-11 ENCOUNTER — Encounter: Payer: Self-pay | Admitting: *Deleted

## 2024-01-20 ENCOUNTER — Other Ambulatory Visit (HOSPITAL_BASED_OUTPATIENT_CLINIC_OR_DEPARTMENT_OTHER): Payer: Self-pay

## 2024-01-20 ENCOUNTER — Encounter: Payer: Self-pay | Admitting: Physician Assistant

## 2024-01-20 ENCOUNTER — Ambulatory Visit (INDEPENDENT_AMBULATORY_CARE_PROVIDER_SITE_OTHER): Payer: 59 | Admitting: Physician Assistant

## 2024-01-20 VITALS — BP 130/86 | HR 85 | Temp 97.9°F | Ht 64.0 in | Wt 331.0 lb

## 2024-01-20 DIAGNOSIS — E282 Polycystic ovarian syndrome: Secondary | ICD-10-CM

## 2024-01-20 DIAGNOSIS — I10 Essential (primary) hypertension: Secondary | ICD-10-CM

## 2024-01-20 DIAGNOSIS — Z0000 Encounter for general adult medical examination without abnormal findings: Secondary | ICD-10-CM

## 2024-01-20 DIAGNOSIS — R2241 Localized swelling, mass and lump, right lower limb: Secondary | ICD-10-CM

## 2024-01-20 DIAGNOSIS — E669 Obesity, unspecified: Secondary | ICD-10-CM

## 2024-01-20 DIAGNOSIS — Z114 Encounter for screening for human immunodeficiency virus [HIV]: Secondary | ICD-10-CM | POA: Diagnosis not present

## 2024-01-20 LAB — COMPREHENSIVE METABOLIC PANEL WITH GFR
ALT: 30 U/L (ref 0–35)
AST: 21 U/L (ref 0–37)
Albumin: 4 g/dL (ref 3.5–5.2)
Alkaline Phosphatase: 70 U/L (ref 39–117)
BUN: 12 mg/dL (ref 6–23)
CO2: 25 meq/L (ref 19–32)
Calcium: 8.8 mg/dL (ref 8.4–10.5)
Chloride: 103 meq/L (ref 96–112)
Creatinine, Ser: 0.94 mg/dL (ref 0.40–1.20)
GFR: 86.11 mL/min (ref >60.00)
Glucose, Bld: 101 mg/dL — ABNORMAL HIGH (ref 70–99)
Potassium: 3.6 meq/L (ref 3.5–5.1)
Sodium: 139 meq/L (ref 135–145)
Total Bilirubin: 0.5 mg/dL (ref 0.2–1.2)
Total Protein: 6.9 g/dL (ref 6.0–8.3)

## 2024-01-20 LAB — CBC WITH DIFFERENTIAL/PLATELET
Basophils Absolute: 0 10*3/uL (ref 0.0–0.1)
Basophils Relative: 0.6 % (ref 0.0–3.0)
Eosinophils Absolute: 0.2 10*3/uL (ref 0.0–0.7)
Eosinophils Relative: 2.4 % (ref 0.0–5.0)
HCT: 44.1 % (ref 36.0–46.0)
Hemoglobin: 14.8 g/dL (ref 12.0–15.0)
Lymphocytes Relative: 44.3 % (ref 12.0–46.0)
Lymphs Abs: 2.9 10*3/uL (ref 0.7–4.0)
MCHC: 33.5 g/dL (ref 30.0–36.0)
MCV: 87.5 fl (ref 78.0–100.0)
Monocytes Absolute: 0.6 10*3/uL (ref 0.1–1.0)
Monocytes Relative: 8.5 % (ref 3.0–12.0)
Neutro Abs: 2.9 10*3/uL (ref 1.4–7.7)
Neutrophils Relative %: 44.2 % (ref 43.0–77.0)
Platelets: 146 10*3/uL — ABNORMAL LOW (ref 150.0–400.0)
RBC: 5.04 Mil/uL (ref 3.87–5.11)
RDW: 13.4 % (ref 11.5–15.5)
WBC: 6.5 10*3/uL (ref 4.0–10.5)

## 2024-01-20 LAB — T4, FREE: Free T4: 1.13 ng/dL (ref 0.60–1.60)

## 2024-01-20 LAB — LIPID PANEL
Cholesterol: 149 mg/dL (ref 0–200)
HDL: 24.7 mg/dL — ABNORMAL LOW (ref >39.00)
LDL Cholesterol: 102 mg/dL — ABNORMAL HIGH (ref 0–99)
NonHDL: 124.58
Total CHOL/HDL Ratio: 6
Triglycerides: 115 mg/dL (ref 0.0–149.0)
VLDL: 23 mg/dL (ref 0.0–40.0)

## 2024-01-20 LAB — T3, FREE: T3, Free: 4.1 pg/mL (ref 2.3–4.2)

## 2024-01-20 LAB — TSH: TSH: 2.48 u[IU]/mL (ref 0.35–5.50)

## 2024-01-20 LAB — HEMOGLOBIN A1C: Hgb A1c MFr Bld: 5.8 % (ref 4.6–6.5)

## 2024-01-20 MED ORDER — METFORMIN HCL ER 500 MG PO TB24
ORAL_TABLET | ORAL | 1 refills | Status: DC
  Filled 2024-01-20: qty 90, 30d supply, fill #0

## 2024-01-20 MED ORDER — LOSARTAN POTASSIUM 100 MG PO TABS
100.0000 mg | ORAL_TABLET | Freq: Every day | ORAL | 1 refills | Status: DC
  Filled 2024-01-20: qty 30, 30d supply, fill #0
  Filled 2024-02-18: qty 30, 30d supply, fill #1
  Filled 2024-03-19: qty 30, 30d supply, fill #2
  Filled 2024-04-20: qty 30, 30d supply, fill #3
  Filled 2024-05-18: qty 30, 30d supply, fill #4
  Filled 2024-06-17: qty 30, 30d supply, fill #5

## 2024-01-20 MED ORDER — CHLORTHALIDONE 25 MG PO TABS
25.0000 mg | ORAL_TABLET | Freq: Every day | ORAL | 1 refills | Status: DC
  Filled 2024-01-20: qty 30, 30d supply, fill #0
  Filled 2024-02-18: qty 30, 30d supply, fill #1
  Filled 2024-03-19: qty 30, 30d supply, fill #2
  Filled 2024-04-20: qty 30, 30d supply, fill #3
  Filled 2024-05-18: qty 30, 30d supply, fill #4
  Filled 2024-06-17: qty 30, 30d supply, fill #5

## 2024-01-20 NOTE — Patient Instructions (Addendum)
 It was great to see you!  Stop current blood pressure medication -- start losartan 100 mg and chlorthalidone 25 mg daily Please keep an eye on your blood pressure -- there are cuffs for sale at your pharmacy if needed  Start metformin 500 mg daily, can increase as prescribed if needed  Keep me posted on your leg swelling  Please go to the lab for blood work.   Our office will call you with your results unless you have chosen to receive results via MyChart.  If your blood work is normal we will follow-up each year for physicals and as scheduled for chronic medical problems.  If anything is abnormal we will treat accordingly and get you in for a follow-up.  Take care,  Lelon Mast

## 2024-01-20 NOTE — Progress Notes (Signed)
 Subjective:    Jill Mccann is a 23 y.o. female and is here for a comprehensive physical exam.  HPI  Health Maintenance Due  Topic Date Due   HIV Screening  Never done    Acute Concerns: Lower extremity swelling She complains of intermittent leg swelling.  She believes it may be tendinitis and reports her mother believes it may be related to her PCOS.  No swelling currently.  Denies any pain.   Chronic Issues: Weight management / Obesity Pt was previously on Metformin while also taking Stratterra in 12/2022.  At the time she stopped taking Metformin due to intolerance.  Her previous insurance did not cover any weight loss injectables.  She is interested in starting meds possibly covered by her new insurance.  She is not interested in self-pay options.   HTN Pt is on Losartan-HCTZ 100-25 mg once daily.  Good compliance and tolerance.  She has not been monitoring her BP recently due to her cuff breaking.  She reports "normal" readings prior to her cuff breaking.   Took her antihypertensives this morning.  In the last week she has been recovering from GI upset and believes her BP was elevated at the time, which is usual for her when ill.   Health Maintenance: Immunizations -- UTD.  Colonoscopy -- N/A Mammogram -- N/A PAP -- UTD, last done 04/23/23. Results were NILM.  Bone Density -- N/A Diet -- Healthy eating overall.  Exercise -- Exercising   Sleep habits -- No concerns today.  Mood -- Stable.   UTD with dentist? - Yes, last seen last month.  UTD with eye doctor? - No, has not gone in several years.   Weight history: Wt Readings from Last 10 Encounters:  01/20/24 (!) 331 lb (150.1 kg)  12/03/23 (!) 337 lb 4 oz (153 kg)  04/23/23 (!) 313 lb (142 kg)  02/08/23 (!) 306 lb (138.8 kg)  01/02/23 (!) 302 lb 4 oz (137.1 kg)  09/12/22 282 lb 8 oz (128.1 kg)  08/13/22 289 lb (131.1 kg)  05/21/22 293 lb (132.9 kg)  04/20/22 295 lb 6 oz (134 kg)  04/19/22 295 lb (133.8  kg)   Body mass index is 56.82 kg/m. No LMP recorded. Patient has had an implant.  Alcohol use:  reports that she does not currently use alcohol.  Tobacco use:  Tobacco Use: Low Risk  (01/20/2024)   Patient History    Smoking Tobacco Use: Never    Smokeless Tobacco Use: Never    Passive Exposure: Never   Eligible for lung cancer screening? no     01/20/2024    9:35 AM  Depression screen PHQ 2/9  Decreased Interest 1  Down, Depressed, Hopeless 0  PHQ - 2 Score 1  Altered sleeping 3  Tired, decreased energy 3  Change in appetite 1  Feeling bad or failure about yourself  0  Trouble concentrating 2  Moving slowly or fidgety/restless 0  Suicidal thoughts 0  PHQ-9 Score 10  Difficult doing work/chores Very difficult     Other providers/specialists: Patient Care Team: Jarold Motto, [place] as PCP - General (Physician Assistant) Tanda Rockers, NP as Nurse Practitioner (Radiology)    PMHx, SurgHx, SocialHx, Medications, and Allergies were reviewed in the Visit Navigator and updated as appropriate.   Past Medical History:  Diagnosis Date   ADHD    Anxiety    Depression    Eczema    HTN (hypertension)    Leg swelling  Obesity    PCOS (polycystic ovarian syndrome)      Past Surgical History:  Procedure Laterality Date   nexplanon  02/21/2021   Left ARM   TONSILLECTOMY       Family History  Problem Relation Age of Onset   Arthritis Mother    Depression Mother    Hypertension Mother    Miscarriages / [place] Mother    Arthritis Father    Diabetes Father    Heart disease Father    Hyperlipidemia Father    Hypertension Father    Asthma Sister    Stroke Maternal Grandmother    Alzheimer's disease Maternal Grandmother    Alcohol abuse Maternal Grandfather    Early death Maternal Grandfather    Hearing loss Maternal Grandfather    Hyperlipidemia Maternal Grandfather    Arthritis Paternal Grandmother    Diabetes Paternal Grandfather     Hypertension Paternal Grandfather    Kidney disease Paternal Grandfather     Social History   Tobacco Use   Smoking status: Never    Passive exposure: Never   Smokeless tobacco: Never  Substance Use Topics   Alcohol use: Not Currently   Drug use: No    Review of Systems:   Review of Systems  Constitutional:  Negative for chills, fever, malaise/fatigue and weight loss.  HENT:  Negative for hearing loss, sinus pain and sore throat.   Respiratory:  Negative for cough and hemoptysis.   Cardiovascular:  Negative for chest pain, palpitations, leg swelling and PND.  Gastrointestinal:  Negative for abdominal pain, constipation, diarrhea, heartburn, nausea and vomiting.  Genitourinary:  Negative for dysuria, frequency and urgency.  Musculoskeletal:  Negative for back pain, myalgias and neck pain.  Skin:  Negative for itching and rash.  Neurological:  Negative for dizziness, tingling, seizures and headaches.  Endo/Heme/Allergies:  Negative for polydipsia.  Psychiatric/Behavioral:  Negative for depression. The patient is not nervous/anxious.       Objective:   BP 130/86 (BP Location: Right Arm, Patient Position: Sitting, Cuff Size: Large)   Pulse 85   Temp 97.9 F (36.6 C) (Temporal)   Ht 5\' 4"  (1.626 m)   Wt (!) 331 lb (150.1 kg)   SpO2 99%   BMI 56.82 kg/m  Body mass index is 56.82 kg/m.   General Appearance:    Alert, cooperative, no distress, appears stated age  Head:    Normocephalic, without obvious abnormality, atraumatic  Eyes:    PERRL, conjunctiva/corneas clear, EOM's intact, fundi    benign, both eyes  Ears:    Normal TM's and external ear canals, both ears  Nose:   Nares normal, septum midline, mucosa normal, no drainage    or sinus tenderness  Throat:   Lips, mucosa, and tongue normal; teeth and gums normal  Neck:   Supple, symmetrical, trachea midline, no adenopathy;    thyroid:  no enlargement/tenderness/nodules; no carotid   bruit or JVD  Back:      Symmetric, no curvature, ROM normal, no CVA tenderness  Lungs:     Clear to auscultation bilaterally, respirations unlabored  Chest Wall:    No tenderness or deformity   Heart:    Regular rate and rhythm, S1 and S2 normal, no murmur, rub or gallop  Breast Exam:    Deferred  Abdomen:     Soft, non-tender, bowel sounds active all four quadrants,    no masses, no organomegaly  Genitalia:    Deferred  Extremities:   Extremities normal, atraumatic, no  cyanosis or edema  Pulses:   2+ and symmetric all extremities  Skin:   Skin color, texture, turgor normal, no rashes or lesions  Lymph nodes:   Cervical, supraclavicular, and axillary nodes normal  Neurologic:   CNII-XII intact, normal strength, sensation and reflexes    throughout    Assessment/Plan:   Routine physical examination Today patient counseled on age appropriate routine health concerns for screening and prevention, each reviewed and up to date or declined. Immunizations reviewed and up to date or declined. Labs ordered and reviewed. Risk factors for depression reviewed and negative. Hearing function and visual acuity are intact. ADLs screened and addressed as needed. Functional ability and level of safety reviewed and appropriate. Education, counseling and referrals performed based on assessed risks today. Patient provided with a copy of personalized plan for preventive services.  Obesity, unspecified class, unspecified obesity type, unspecified whether serious comorbidity present Ongoing She is pursuing bariatric surgery -- letter written today Unfortunately GLP-1s are not covered on her plan Start metformin 500 mg XR daily, after two weeks may increase to 1000 mg XR Continue efforts at healthy lifestyle Follow up in 3 month(s) after starting metformin, sooner if concerns  PCOS (polycystic ovarian syndrome) Ongoing Start metformin 500 mg XR daily, after two weeks may increase to 1000 mg XR Continue efforts at healthy  lifestyle Follow up in 3 month(s) after starting metformin, sooner if concerns  Primary hypertension Above goal today No evidence of end-organ damage on my exam Recommend patient monitor home blood pressure at least a few times weekly Stop current blood pressure medication -- start losartan 100 mg and chlorthalidone 25 mg daily If home monitoring shows consistent elevation, or any symptom(s) develop, recommend reach out to [place] for further advice on next steps Follow up in 3 month(s), sooner if concerns  Screening for HIV (human immunodeficiency virus) Update HIV   Localized swelling of right lower leg Denies any concerns Prior ultrasound negative for deep vein thrombosis Asked for her to take a picture next time it occurs and we can determine next steps   I, Isabelle Course, acting as a Neurosurgeon for [employer], [place]., have documented all relevant documentation on the behalf of Jarold Motto, [place], as directed by  Jarold Motto, PA while in the presence of Jarold Motto, [place].  I, Jarold Motto, [place], have reviewed all documentation for this visit. The documentation on 01/20/24 for the exam, diagnosis, procedures, and orders are all accurate and complete.  Jarold Motto, PA-C [facility]

## 2024-01-21 ENCOUNTER — Other Ambulatory Visit (HOSPITAL_BASED_OUTPATIENT_CLINIC_OR_DEPARTMENT_OTHER): Payer: Self-pay

## 2024-01-21 ENCOUNTER — Encounter: Payer: Self-pay | Admitting: Physician Assistant

## 2024-01-21 LAB — HIV ANTIBODY (ROUTINE TESTING W REFLEX): HIV 1&2 Ab, 4th Generation: NONREACTIVE

## 2024-01-29 ENCOUNTER — Encounter: Payer: Self-pay | Admitting: Physician Assistant

## 2024-02-11 ENCOUNTER — Ambulatory Visit: Payer: Self-pay

## 2024-02-11 NOTE — Telephone Encounter (Signed)
 Noted, appt tomorrow

## 2024-02-11 NOTE — Telephone Encounter (Signed)
 Chief Complaint: Anxiety, wanting to start on medication Symptoms: Chest tightness last night (not currently), heart racing (not currently) Frequency: worse over the past 1-2 weeks Pertinent Negatives: Patient denies suicide or self harm. Disposition: [] ED /[] Urgent Care (no appt availability in office) / [x] Appointment(In office/virtual)/ []  [place] [facility]/ [] Home Care/ [] Refused Recommended Disposition /[] [place] [facility]/ []  Follow-up with PCP Additional Notes: Patient says she's having feeling of anxiety and panic mainly due to current health conditions and the thought of having heart problems like her dad. She says she is constantly looking at her heart rate on her apple watch to the point it's consuming her. She says right now she's at work and her hands are shaking, she's feeling anxious, 9/10 severity and depending on the situation at hand it is worse. Last night she says she had heart palpitations, chest tightness and wasn't able to sleep until early morning. She denies thoughts of suicide or self harm. She says she's about to graduate college and it's overwhelming thinking about that, job, and just life after college, procrastinates at work, not motivated to do school work that are [employer]. Denies any symptoms at this time. Advised OV tomorrow, she agrees.   Copied from CRM [id number]. Topic: Clinical - Red Word Triage >> Feb 11, 2024  2:56 PM Zipporah Him wrote: Red Word that prompted transfer to Nurse Triage: experiencing health related anxiety, not currently on medications for it, hasn't been for a while. Felt like she was fine but is now overwhelmed and on edge, doesn't know how to proceed. Reason for Disposition  MODERATE anxiety (e.g., persistent or frequent anxiety symptoms; interferes with sleep, school, or work)  Answer Assessment - Initial Assessment Questions 1. CONCERN: "Did anything happen that prompted you to call today?"      Calling to make an appointment,  anxiety regarding heart issues 2. ANXIETY SYMPTOMS: "Can you describe how you (your loved one; patient) have been feeling?" (e.g., tense, restless, panicky, anxious, keyed up, overwhelmed, sense of impending doom).      Anxious regarding health is through the roof, panicky (tightness in chest last night), stress related, constantly looking at pulse on apple watch 3. ONSET: "How long have you been feeling this way?" (e.g., hours, days, weeks)     For a week or 2, but worse over the past few days 4. SEVERITY: "How would you rate the level of anxiety?" (e.g., 0 - 10; or mild, moderate, severe).     It depends on the moment, but 9 at this time (at work, feeling anxious) 5. 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 HISTORY: "Have you felt this way before?" "Have you ever been diagnosed with an anxiety problem in the past?" (e.g., generalized anxiety disorder, panic attacks, PTSD). If Yes, ask: "How was this problem treated?" (e.g., medicines, counseling, etc.)     On medication since middle school, but got better on ADHD medications, but stopped due to BP issues 7. RISK OF HARM - SUICIDAL IDEATION: "Do you ever have thoughts of hurting or killing yourself?" If Yes, ask:  "Do you have these feelings now?" "Do you have a plan on how you would do this?"     No 8.  TREATMENT:  "What has been done so far to treat this anxiety?" (e.g., medicines, relaxation strategies). "What has helped?"     Relax with a bath, pet dog, fidget games on phone to play 9. TREATMENT - THERAPIST: "Do you have a counselor or therapist? Name?"     No 10. POTENTIAL TRIGGERS: "Do you drink caffeinated beverages (e.g., coffee,  colas, teas), and how much daily?" "Do you drink alcohol or use any drugs?" "Have you started any new medicines recently?"       No 11. PATIENT SUPPORT: "Who is with you now?" "Who do you live with?" "Do you have family or friends who you can talk to?"        Family 12. OTHER SYMPTOMS: "Do you have any other symptoms?" (e.g., feeling depressed, trouble concentrating, trouble sleeping, trouble breathing, palpitations or fast heartbeat, chest pain, sweating, nausea, or diarrhea)       Fast heartbeat sometimes when get anxious, but no symptoms at present time  Protocols used: Anxiety and Panic Attack-A-AH

## 2024-02-12 ENCOUNTER — Telehealth: Payer: Self-pay | Admitting: *Deleted

## 2024-02-12 ENCOUNTER — Ambulatory Visit: Admitting: Physician Assistant

## 2024-02-12 ENCOUNTER — Ambulatory Visit: Payer: Self-pay

## 2024-02-12 NOTE — Telephone Encounter (Signed)
 Jill Mccann notified pt was in an accident and that is why she missed appt.

## 2024-02-12 NOTE — Telephone Encounter (Signed)
Noted, pt scheduled tomorrow.
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
## 2024-02-13 ENCOUNTER — Ambulatory Visit (INDEPENDENT_AMBULATORY_CARE_PROVIDER_SITE_OTHER): Admitting: Physician Assistant

## 2024-02-13 ENCOUNTER — Other Ambulatory Visit (HOSPITAL_BASED_OUTPATIENT_CLINIC_OR_DEPARTMENT_OTHER): Payer: Self-pay

## 2024-02-13 ENCOUNTER — Encounter: Payer: Self-pay | Admitting: Physician Assistant

## 2024-02-13 VITALS — BP 140/90 | HR 112 | Temp 98.2°F | Ht 64.0 in | Wt 329.0 lb

## 2024-02-13 DIAGNOSIS — M79601 Pain in right arm: Secondary | ICD-10-CM

## 2024-02-13 DIAGNOSIS — I10 Essential (primary) hypertension: Secondary | ICD-10-CM

## 2024-02-13 DIAGNOSIS — F419 Anxiety disorder, unspecified: Secondary | ICD-10-CM | POA: Diagnosis not present

## 2024-02-13 MED ORDER — SERTRALINE HCL 25 MG PO TABS
25.0000 mg | ORAL_TABLET | Freq: Every day | ORAL | 1 refills | Status: DC
  Filled 2024-02-13: qty 30, 30d supply, fill #0
  Filled 2024-03-12: qty 30, 30d supply, fill #1

## 2024-02-13 MED ORDER — PROPRANOLOL HCL 20 MG PO TABS
20.0000 mg | ORAL_TABLET | Freq: Three times a day (TID) | ORAL | 1 refills | Status: AC | PRN
  Filled 2024-02-13: qty 30, 10d supply, fill #0

## 2024-02-13 MED ORDER — HYDROXYZINE HCL 10 MG PO TABS
10.0000 mg | ORAL_TABLET | Freq: Three times a day (TID) | ORAL | 1 refills | Status: AC | PRN
  Filled 2024-02-13: qty 30, 10d supply, fill #0

## 2024-02-13 NOTE — Patient Instructions (Signed)
 It was great to see you!  Trial hydroxyzine  10 mg three times daily as needed May also trial propranolol  20 mg three times daily as needed  Start Zoloft  25 mg daily Take with food  Psychology referral for talk therapy  Cardiology referral  Let's follow-up in 1 month, sooner if you have concerns.  Take care,  Alexander Iba PA-C
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
## 2024-02-28 ENCOUNTER — Encounter (HOSPITAL_BASED_OUTPATIENT_CLINIC_OR_DEPARTMENT_OTHER): Payer: Self-pay | Admitting: Cardiology

## 2024-02-28 ENCOUNTER — Ambulatory Visit (INDEPENDENT_AMBULATORY_CARE_PROVIDER_SITE_OTHER): Admitting: Cardiology

## 2024-02-28 ENCOUNTER — Ambulatory Visit: Admitting: Radiology

## 2024-02-28 ENCOUNTER — Encounter (HOSPITAL_BASED_OUTPATIENT_CLINIC_OR_DEPARTMENT_OTHER): Payer: Self-pay

## 2024-02-28 VITALS — BP 110/82 | HR 89 | Ht 63.0 in | Wt 325.6 lb

## 2024-02-28 DIAGNOSIS — I479 Paroxysmal tachycardia, unspecified: Secondary | ICD-10-CM

## 2024-02-28 DIAGNOSIS — Z7189 Other specified counseling: Secondary | ICD-10-CM

## 2024-02-28 DIAGNOSIS — I10 Essential (primary) hypertension: Secondary | ICD-10-CM | POA: Diagnosis not present

## 2024-02-28 DIAGNOSIS — Z8249 Family history of ischemic heart disease and other diseases of the circulatory system: Secondary | ICD-10-CM

## 2024-02-28 NOTE — Patient Instructions (Addendum)
 Medication Instructions:  Your physician recommends that you continue on your current medications as directed. Please refer to the Current Medication list given to you today.   Follow-Up: Please follow up in  2 years with Dr. Veryl Gottron, Slater Duncan, NP or Neomi Banks, NP    Other Instructions: Look into [facility] for fitted compression stockings: [address] [place], [place], [place] [address] [phone] [email or web address]

## 2024-02-28 NOTE — Progress Notes (Signed)
 Cardiology Office Note:  .   Date:  02/28/2024  ID:  Jill Mccann, DOB 04-27-2001, MRN [id number] PCP: Alexander Iba, PA  [facility] Providers Cardiologist:  Sheryle Donning, MD {  History of Present Illness: .   Jill Mccann is a 23 y.o. female hypertension, FH of premature CAD.  Today: Referred by Alexander Iba 02/13/24 for hypertension. At that visit, noted increased anxiety. BP elevated at that visit, 140/90, despite chlorthalidone  25 mg daily and losartan  100 mg daily.   Blood pressure much better today.   Father has 5V CABG age 17. All of father's side have heart issues/high blood pressure. Mother's biological father died at young age (presumed heart, though also was a heavy drinker). Notes that this contributes to her significant health anxiety. Has occasional LE edema, u/s did not show clot. Does stand all day at work.  Doesn't take propranolol  every day, takes it when she needs it. Has been scared to be active due to periodic elevated heart rate. We discussed vagal tone and exercise conditioning at length.   Metformin  makes her chest hurt, stopped when she stopped it.  Meeting with surgery team to discuss bariatric surgery.   ROS: Denies shortness of breath at rest. No PND, orthopnea, or unexpected weight gain. No syncope. ROS otherwise negative except as noted.   Studies Reviewed: Aaron Aas    EKG:  EKG Interpretation Date/Time:  Friday Feb 28 2024 15:11:41 EDT Ventricular Rate:  89 PR Interval:  134 QRS Duration:  78 QT Interval:  358 QTC Calculation: 435 R Axis:   5  Text Interpretation: Normal sinus rhythm Minimal voltage criteria for LVH, may be normal variant Confirmed by Sheryle Donning [phone]) on 02/28/2024 3:25:13 PM    Physical Exam:   VS:  BP 110/82 (BP Location: Left Arm, Patient Position: Sitting, Cuff Size: Large)   Pulse 89   Ht 5\' 3"  (1.6 m)   Wt (!) 325 lb 9.6 oz (147.7 kg)   SpO2 96%   BMI 57.68 kg/m    Wt Readings from Last 3  Encounters:  02/28/24 (!) 325 lb 9.6 oz (147.7 kg)  02/13/24 (!) 329 lb (149.2 kg)  01/20/24 (!) 331 lb (150.1 kg)    GEN: Well nourished, well developed in no acute distress HEENT: Normal, moist mucous membranes NECK: No JVD CARDIAC: regular rhythm, normal S1 and S2, no rubs or gallops. No murmur. VASCULAR: Radial and DP pulses 2+ bilaterally. No carotid bruits RESPIRATORY:  Clear to auscultation without rales, wheezing or rhonchi  ABDOMEN: Soft, non-tender, non-distended MUSCULOSKELETAL:  Ambulates independently SKIN: Warm and dry, no edema NEUROLOGIC:  Alert and oriented x 3. No focal neuro deficits noted. PSYCHIATRIC:  Normal affect    ASSESSMENT AND PLAN: .    Hypertension -excellent control today.  -continue chlorthalidone , losartan   Intermittent tachycardia -we discussed sinus tachycardia vs. Arrhythmia today. No documented arrhythmia. Discussed maximum age related target heart rate with activity -has propranolol  PRN  Family history of premature CAD -would consider calcium score or other available screening (guideline recommendations start age 4)  Morbid obesity, BMI 71 -discussing bariatric surgery, I think this is a good option for her to decrease her future CV risk. She is low risk for surgery from a cardiac perspective  CV risk counseling and prevention -recommend heart healthy/Mediterranean diet, with whole grains, fruits, vegetable, fish, lean meats, nuts, and olive oil. Limit salt. -recommend moderate walking, 3-5 times/week for 30-50 minutes each session. Aim for at least 150 minutes.week. Goal should be  pace of 3 miles/hours, or walking 1.5 miles in 30 minutes -recommend avoidance of tobacco products. Avoid excess alcohol. -ASCVD risk score: The ASCVD Risk score (Arnett DK, et al., 2019) failed to calculate for the following reasons:   The 2019 ASCVD risk score is only valid for ages 17 to 68    Dispo: given her family history, we will see her back in 2 years  or sooner as needed  Signed, Sheryle Donning, MD   Sheryle Donning, MD, PhD, [facility]  [facility]  [facility] [address], [address] [place], [place] [address] [phone]

## 2024-03-14 ENCOUNTER — Other Ambulatory Visit (HOSPITAL_BASED_OUTPATIENT_CLINIC_OR_DEPARTMENT_OTHER): Payer: Self-pay

## 2024-03-17 ENCOUNTER — Ambulatory Visit (INDEPENDENT_AMBULATORY_CARE_PROVIDER_SITE_OTHER): Admitting: Behavioral Health

## 2024-03-17 ENCOUNTER — Ambulatory Visit (INDEPENDENT_AMBULATORY_CARE_PROVIDER_SITE_OTHER): Admitting: Physician Assistant

## 2024-03-17 VITALS — BP 130/80 | HR 83 | Temp 97.9°F | Ht 63.0 in | Wt 329.5 lb

## 2024-03-17 DIAGNOSIS — F332 Major depressive disorder, recurrent severe without psychotic features: Secondary | ICD-10-CM | POA: Diagnosis not present

## 2024-03-17 DIAGNOSIS — F902 Attention-deficit hyperactivity disorder, combined type: Secondary | ICD-10-CM

## 2024-03-17 DIAGNOSIS — I10 Essential (primary) hypertension: Secondary | ICD-10-CM

## 2024-03-17 DIAGNOSIS — F419 Anxiety disorder, unspecified: Secondary | ICD-10-CM
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
## 2024-03-17 NOTE — Patient Instructions (Signed)
 It was great to see you!  Work on getting your paperwork completed for surgery and let me know how I can best support you  Continue Zoloft  25 mg daily  Let's follow-up in 6 month(s), sooner if you have concerns.  Take care,  Alexander Iba PA-C

## 2024-03-17 NOTE — Progress Notes (Signed)
   Jill Lever, LMFT

## 2024-03-17 NOTE — Progress Notes (Signed)
 Jill Mccann is a 23 y.o. female here for a follow up of a pre-existing problem.  History of Present Illness:   Chief Complaint  Patient presents with   Anxiety    Pt is feeling a lot better since starting medication Zoloft .    Anxiety: Pt is on Zoloft  25 mg once daily, Hydroxyzine  10 mg TID as needed, and Propanolol 20 mg TID as needed.  She has "vivid and crazy" dreams, but overall no adverse side effects.  When first starting Zoloft , she was taking her Hydroxyzine  regularly for panic attacks.  Over the past 2 weeks she has noticed significant improvements and has not needed to take Hydroxyzine . She continues to carry the Hydroxyzine  with her at all times, in case she may need it.  Pt would like to stay at her current dose of Zoloft  for now.  Denies any nausea, diarrhea, or headaches.   HTN: Currently on Losartan  100 mg daily and chlorthalidone  25 mg daily with good compliance and tolerance.  Pt reports her BP has improved since starting Zoloft .  She has followed up with cardiology and will follow up every 2 years.   Past Medical History:  Diagnosis Date   ADHD    Anxiety    Depression    Eczema    HTN (hypertension)    Leg swelling    Obesity    PCOS (polycystic ovarian syndrome)      Social History   Tobacco Use   Smoking status: Never    Passive exposure: Never   Smokeless tobacco: Never  Substance Use Topics   Alcohol use: Not Currently   Drug use: No    Past Surgical History:  Procedure Laterality Date   nexplanon   02/21/2021   Left ARM   TONSILLECTOMY      Family History  Problem Relation Age of Onset   Arthritis Mother    Depression Mother    Hypertension Mother    Miscarriages / [place] Mother    Arthritis Father    Diabetes Father    Heart disease Father    Hyperlipidemia Father    Hypertension Father    Asthma Sister    Stroke Maternal Grandmother    Alzheimer's disease Maternal Grandmother    Alcohol abuse Maternal Grandfather     Early death Maternal Grandfather    Hearing loss Maternal Grandfather    Hyperlipidemia Maternal Grandfather    Arthritis Paternal Grandmother    Diabetes Paternal Grandfather    Hypertension Paternal Grandfather    Kidney disease Paternal Grandfather     Allergies  Allergen Reactions   Justicia Adhatoda (Malabar Nut Tree) [Justicia Adhatoda] Anaphylaxis   Other Anaphylaxis    Pecans   Hydrocodone Nausea And Vomiting    Current Medications:   Current Outpatient Medications:    chlorthalidone  (HYGROTON ) 25 MG tablet, Take 1 tablet (25 mg total) by mouth daily., Disp: 90 tablet, Rfl: 1   etonogestrel  (NEXPLANON ) 68 MG IMPL implant, Inserted 02/21/21 left arm, Disp: , Rfl:    hydrOXYzine  (ATARAX ) 10 MG tablet, Take 1 tablet (10 mg total) by mouth 3 (three) times daily as needed., Disp: 30 tablet, Rfl: 1   losartan  (COZAAR ) 100 MG tablet, Take 1 tablet (100 mg total) by mouth daily., Disp: 90 tablet, Rfl: 1   propranolol  (INDERAL ) 20 MG tablet, Take 1 tablet (20 mg total) by mouth 3 (three) times daily as needed., Disp: 30 tablet, Rfl: 1   sertraline  (ZOLOFT ) 25 MG tablet, Take 1 tablet (25 mg  total) by mouth daily., Disp: 30 tablet, Rfl: 1   Review of Systems:   Negative unless otherwise specified per HPI.  Vitals:   Vitals:   03/17/24 1022  BP: 130/80  Pulse: 83  Temp: 97.9 F (36.6 C)  TempSrc: Temporal  SpO2: 95%  Weight: (!) 329 lb 8 oz (149.5 kg)  Height: 5\' 3"  (1.6 m)     Body mass index is 58.37 kg/m.  Physical Exam:   Physical Exam Constitutional:      Appearance: Normal appearance. She is well-developed.  HENT:     Head: Normocephalic and atraumatic.  Eyes:     General: Lids are normal.     Extraocular Movements: Extraocular movements intact.     Conjunctiva/sclera: Conjunctivae normal.  Pulmonary:     Effort: Pulmonary effort is normal.  Musculoskeletal:        General: Normal range of motion.     Cervical back: Normal range of motion and neck  supple.  Skin:    General: Skin is warm and dry.  Neurological:     Mental Status: She is alert and oriented to person, place, and time.  Psychiatric:        Attention and Perception: Attention and perception normal.        Mood and Affect: Mood normal.        Behavior: Behavior normal.        Thought Content: Thought content normal.        Judgment: Judgment normal.     Assessment and Plan:   1. Anxiety (Primary) Well controlled Continue Zoloft  25 mg daily Tolerating well, denies need for increase Continue as needed propranolol  20 mg three times daily or hydroxyzine  10 mg three times daily Follow up in 6 month(s), sooner if concerns   2. Primary hypertension Normotensive Continue chlorthalidone  25 mg daily and losartan  100 mg daily Follow up in 6 month(s), sooner if elevated readings at home  I, Bernita Bristle, acting as a scribe for Alexander Iba, [place]., have documented all relevant documentation on the behalf of Alexander Iba, [place], as directed by   while in the presence of Alexander Iba, [place].  I, Alexander Iba, [place], have reviewed all documentation for this visit. The documentation on 03/17/24 for the exam, diagnosis, procedures, and orders are all accurate and complete.  Alexander Iba, PA-C

## 2024-04-13 ENCOUNTER — Other Ambulatory Visit (HOSPITAL_BASED_OUTPATIENT_CLINIC_OR_DEPARTMENT_OTHER): Payer: Self-pay

## 2024-04-13 ENCOUNTER — Other Ambulatory Visit: Payer: Self-pay | Admitting: Physician Assistant

## 2024-04-13 MED ORDER — SERTRALINE HCL 25 MG PO TABS
25.0000 mg | ORAL_TABLET | Freq: Every day | ORAL | 1 refills | Status: DC
  Filled 2024-04-13: qty 30, 30d supply, fill #0
  Filled 2024-05-11: qty 30, 30d supply, fill #1

## 2024-04-14 ENCOUNTER — Other Ambulatory Visit (HOSPITAL_BASED_OUTPATIENT_CLINIC_OR_DEPARTMENT_OTHER): Payer: Self-pay

## 2024-04-14 ENCOUNTER — Ambulatory Visit (INDEPENDENT_AMBULATORY_CARE_PROVIDER_SITE_OTHER): Admitting: Radiology

## 2024-04-14 ENCOUNTER — Encounter: Payer: Self-pay | Admitting: Radiology

## 2024-04-14 VITALS — BP 126/82 | HR 101 | Wt 326.8 lb

## 2024-04-14 DIAGNOSIS — Z3046 Encounter for surveillance of implantable subdermal contraceptive: Secondary | ICD-10-CM | POA: Diagnosis not present

## 2024-04-14 DIAGNOSIS — Z3009 Encounter for other general counseling and advice on contraception: Secondary | ICD-10-CM | POA: Diagnosis not present

## 2024-04-14 NOTE — Progress Notes (Signed)
   23 y.o. G0P0000 female presents for Nexplanon  removal.  Reason for removal end of 3 years.  She is unsure what she would like to use for future contraception.  Procedure, risks and benefits have all been explained.   After all questions were answered, consent was obtained.    Past Medical History:  Diagnosis Date   ADHD    Anxiety    Depression    Eczema    HTN (hypertension)    Leg swelling    Obesity    PCOS (polycystic ovarian syndrome)     Past Surgical History:  Procedure Laterality Date   nexplanon   02/21/2021   Left ARM   TONSILLECTOMY      Current Outpatient Medications on File Prior to Visit  Medication Sig Dispense Refill   chlorthalidone  (HYGROTON ) 25 MG tablet Take 1 tablet (25 mg total) by mouth daily. 90 tablet 1   etonogestrel  (NEXPLANON ) 68 MG IMPL implant Inserted 02/21/21 left arm     hydrOXYzine  (ATARAX ) 10 MG tablet Take 1 tablet (10 mg total) by mouth 3 (three) times daily as needed. 30 tablet 1   losartan  (COZAAR ) 100 MG tablet Take 1 tablet (100 mg total) by mouth daily. 90 tablet 1   propranolol  (INDERAL ) 20 MG tablet Take 1 tablet (20 mg total) by mouth 3 (three) times daily as needed. 30 tablet 1   sertraline  (ZOLOFT ) 25 MG tablet Take 1 tablet (25 mg total) by mouth daily. 30 tablet 1   No current facility-administered medications on file prior to visit.   Allergies  Allergen Reactions   Justicia Adhatoda (Malabar Nut Tree) [Justicia Adhatoda] Anaphylaxis   Other Anaphylaxis    Pecans   Hydrocodone Nausea And Vomiting    Vitals:   04/14/24 1425  BP: 126/82  Pulse: (!) 101  SpO2: 97%   Physical Exam Constitutional:      Appearance: Normal appearance. She is morbidly obese.  Pulmonary:     Effort: Pulmonary effort is normal.   Neurological:     Mental Status: She is alert.   Psychiatric:        Mood and Affect: Mood normal.        Thought Content: Thought content normal.        Judgment: Judgment normal.    Time out performed  and informed consent received.  Procedure: Patient placed supine on exam table with her left arm flexed at the elbow. The prior insertion site was located and the Nexplanon  rod was palpated.  Area cleansed with Betadine x 3 and draped in normal sterile fashion.  Insertion site and surrounding tissue anesthetized with 1% Lidocaine without epinephrine, 2cc total used.  Small incision made with #11 blade.  Nexplanon  removed without difficulty.  Steri-strips were applied and pressure dressing placed over the site.  Entire procedure performed with sterile technique.  Pt tolerated procedure well. Chaperone, [name], CMA was present for the entirety of the procedure  Assessment/Plan:   1. Nexplanon  removal (Primary) Post procedure instructions reviewed with pt.  Questions answered.  Pt knows to call with any concerns or questions.   2. Birth control counseling  New contraception:  unsure, will abstain until she decides Due to her HTN only BC options are POPs, Nexplanon , IUD, phexxi and condoms   Shasta Bland, [facility] 04/14/24

## 2024-04-22 ENCOUNTER — Ambulatory Visit: Admitting: Radiology

## 2024-04-27 ENCOUNTER — Ambulatory Visit (INDEPENDENT_AMBULATORY_CARE_PROVIDER_SITE_OTHER): Admitting: Physician Assistant

## 2024-04-30 ENCOUNTER — Ambulatory Visit: Admitting: Behavioral Health

## 2024-05-25 ENCOUNTER — Encounter: Payer: Self-pay | Admitting: Radiology

## 2024-05-25 ENCOUNTER — Ambulatory Visit (INDEPENDENT_AMBULATORY_CARE_PROVIDER_SITE_OTHER): Admitting: Radiology

## 2024-05-25 ENCOUNTER — Other Ambulatory Visit (HOSPITAL_BASED_OUTPATIENT_CLINIC_OR_DEPARTMENT_OTHER): Payer: Self-pay

## 2024-05-25 VITALS — BP 128/74 | HR 110 | Ht 65.0 in | Wt 330.0 lb

## 2024-05-25 DIAGNOSIS — Z01419 Encounter for gynecological examination (general) (routine) without abnormal findings: Secondary | ICD-10-CM

## 2024-05-25 DIAGNOSIS — Z1331 Encounter for screening for depression: Secondary | ICD-10-CM

## 2024-05-25 DIAGNOSIS — E282 Polycystic ovarian syndrome: Secondary | ICD-10-CM

## 2024-05-25 DIAGNOSIS — N914 Secondary oligomenorrhea: Secondary | ICD-10-CM

## 2024-05-25 MED ORDER — MEDROXYPROGESTERONE ACETATE 10 MG PO TABS
10.0000 mg | ORAL_TABLET | Freq: Every day | ORAL | 3 refills | Status: AC
  Filled 2024-05-25 – 2024-06-12 (×2): qty 10, 10d supply, fill #0
  Filled 2024-08-10: qty 10, 10d supply, fill #1
  Filled 2024-09-14: qty 10, 10d supply, fill #2

## 2024-05-25 NOTE — Patient Instructions (Signed)

## 2024-05-25 NOTE — Progress Notes (Signed)
 Jill Mccann [birth year] [id number]   History:  23 y.o. G0 presents for annual exam. Nexplanon  removed 04/14/24 no menses since. Using condoms for [facility].  Gynecologic History No LMP recorded.   Contraception/Family planning: condoms Sexually active: yes Last Pap: 2024. Results were: normal  Obstetric History OB History  Gravida Para Term Preterm AB Living  0 0 0 0 0 0  SAB IAB Ectopic Multiple Live Births  0 0 0 0 0       05/25/2024    3:40 PM 03/17/2024   10:27 AM 02/13/2024    8:16 AM  Depression screen PHQ 2/9  Decreased Interest 1 0 3  Down, Depressed, Hopeless 0 0 0  PHQ - 2 Score 1 0 3  Altered sleeping   3  Tired, decreased energy   3  Change in appetite   1  Feeling bad or failure about yourself    0  Trouble concentrating   3  Moving slowly or fidgety/restless   1  Suicidal thoughts   0  PHQ-9 Score   14  Difficult doing work/chores  Not difficult at all Extremely dIfficult     The following portions of the patient's history were reviewed and updated as appropriate: allergies, current medications, past family history, past medical history, past social history, past surgical history, and problem list.  Review of Systems  All other systems reviewed and are negative.   Past medical history, past surgical history, family history and social history were all reviewed and documented in the EPIC chart.  Exam:  Vitals:   05/25/24 1537  BP: 128/74  Pulse: (!) 110  SpO2: 98%  Weight: (!) 330 lb (149.7 kg)  Height: 5' 5 (1.651 m)   Body mass index is 54.91 kg/m.  Physical Exam Vitals and nursing note reviewed. Exam conducted with a chaperone present.  Constitutional:      Appearance: Normal appearance. She is morbidly obese.  HENT:     Head: Normocephalic and atraumatic.  Neck:     Thyroid: No thyroid mass, thyromegaly or thyroid tenderness.  Cardiovascular:     Rate and Rhythm: Regular rhythm.     Heart sounds: Normal heart sounds.  Pulmonary:      Effort: Pulmonary effort is normal.     Breath sounds: Normal breath sounds.  Chest:  Breasts:    Breasts are symmetrical.     Right: Normal. No inverted nipple, mass, nipple discharge, skin change or tenderness.     Left: Normal. No inverted nipple, mass, nipple discharge, skin change or tenderness.  Abdominal:     General: Abdomen is flat. Bowel sounds are normal.     Palpations: Abdomen is soft.  Genitourinary:    General: Normal vulva.     Vagina: Normal. No vaginal discharge, bleeding or lesions.     Cervix: Normal. No discharge or lesion.     Uterus: Normal. Not enlarged and not tender.      Adnexa: Right adnexa normal and left adnexa normal.       Right: No mass, tenderness or fullness.         Left: No mass, tenderness or fullness.    Lymphadenopathy:     Upper Body:     Right upper body: No axillary adenopathy.     Left upper body: No axillary adenopathy.  Skin:    General: Skin is warm and dry.  Neurological:     Mental Status: She is alert and oriented to person, place, and time.  Psychiatric:        Mood and Affect: Mood normal.        Thought Content: Thought content normal.        Judgment: Judgment normal.      [name], CMA present for exam  Assessment/Plan:   1. Well woman exam with routine gynecological exam (Primary) Pap 2027 Declines STI screening  2. Depression screen negative  3. PCOS with Secondary oligomenorrhea  Provera  10mg  x 10 days. Rx sent with refills. Will take if no menses x 3 months Consider other BC options     Return in about 1 year (around 05/25/2025) for Annual.  GINETTE COZIER B WHNP-BC 3:55 PM 05/25/2024

## 2024-06-04 ENCOUNTER — Other Ambulatory Visit (HOSPITAL_BASED_OUTPATIENT_CLINIC_OR_DEPARTMENT_OTHER): Payer: Self-pay

## 2024-06-11 ENCOUNTER — Other Ambulatory Visit: Payer: Self-pay | Admitting: Physician Assistant

## 2024-06-12 ENCOUNTER — Other Ambulatory Visit (HOSPITAL_BASED_OUTPATIENT_CLINIC_OR_DEPARTMENT_OTHER): Payer: Self-pay

## 2024-06-12 MED ORDER — SERTRALINE HCL 25 MG PO TABS
25.0000 mg | ORAL_TABLET | Freq: Every day | ORAL | 1 refills | Status: DC
Start: 2024-06-12 — End: 2024-08-10
  Filled 2024-06-12: qty 30, 30d supply, fill #0
  Filled 2024-07-09: qty 30, 30d supply, fill #1

## 2024-07-10 ENCOUNTER — Other Ambulatory Visit (HOSPITAL_BASED_OUTPATIENT_CLINIC_OR_DEPARTMENT_OTHER): Payer: Self-pay

## 2024-07-16 ENCOUNTER — Other Ambulatory Visit: Payer: Self-pay | Admitting: Physician Assistant

## 2024-07-17 ENCOUNTER — Telehealth: Payer: Self-pay | Admitting: Physician Assistant

## 2024-07-17 ENCOUNTER — Other Ambulatory Visit (HOSPITAL_BASED_OUTPATIENT_CLINIC_OR_DEPARTMENT_OTHER): Payer: Self-pay

## 2024-07-17 MED ORDER — LOSARTAN POTASSIUM 100 MG PO TABS
100.0000 mg | ORAL_TABLET | Freq: Every day | ORAL | 1 refills | Status: AC
  Filled 2024-07-17: qty 30, 30d supply, fill #0
  Filled 2024-08-14: qty 30, 30d supply, fill #1
  Filled 2024-09-14: qty 30, 30d supply, fill #2
  Filled 2024-10-12: qty 30, 30d supply, fill #3
  Filled 2024-11-10: qty 30, 30d supply, fill #4

## 2024-07-17 MED ORDER — CHLORTHALIDONE 25 MG PO TABS
25.0000 mg | ORAL_TABLET | Freq: Every day | ORAL | 1 refills | Status: AC
  Filled 2024-07-17: qty 30, 30d supply, fill #0
  Filled 2024-08-14: qty 30, 30d supply, fill #1
  Filled 2024-09-14: qty 30, 30d supply, fill #2
  Filled 2024-10-12: qty 30, 30d supply, fill #3
  Filled 2024-11-09: qty 30, 30d supply, fill #4

## 2024-07-17 NOTE — Telephone Encounter (Signed)
 Copied from CRM [id number]. Topic: Clinical - Medication Refill >> Jul 17, 2024  1:09 PM Rosina BIRCH wrote: Medication: chlorthalidone  and losartan   Has the patient contacted their pharmacy? No (Agent: If no, request that the patient contact the pharmacy for the refill. If patient does not wish to contact the pharmacy document the reason why and proceed with request.) (Agent: If yes, when and what did the pharmacy advise?)  This is the patient's preferred pharmacy:  [facility] [address] [place] [address] Phone: [phone] Fax: [phone]  Is this the correct pharmacy for this prescription? Yes If no, delete pharmacy and type the correct one.   Has the prescription been filled recently? No  Is the patient out of the medication? No  Has the patient been seen for an appointment in the last year OR does the patient have an upcoming appointment? Yes  Can we respond through MyChart? Yes  Agent: Please be advised that Rx refills may take up to 3 business days. We ask that you follow-up with your pharmacy.

## 2024-07-24 ENCOUNTER — Other Ambulatory Visit (HOSPITAL_BASED_OUTPATIENT_CLINIC_OR_DEPARTMENT_OTHER): Payer: Self-pay

## 2024-07-24 ENCOUNTER — Encounter: Payer: Self-pay | Admitting: Radiology

## 2024-07-24 ENCOUNTER — Ambulatory Visit: Admitting: Radiology

## 2024-07-24 VITALS — BP 132/80 | Temp 98.7°F | Wt 337.0 lb

## 2024-07-24 DIAGNOSIS — R300 Dysuria: Secondary | ICD-10-CM

## 2024-07-24 DIAGNOSIS — N3090 Cystitis, unspecified without hematuria: Secondary | ICD-10-CM | POA: Diagnosis not present

## 2024-07-24 DIAGNOSIS — N898 Other specified noninflammatory disorders of vagina: Secondary | ICD-10-CM

## 2024-07-24 LAB — WET PREP FOR TRICH, YEAST, CLUE

## 2024-07-24 MED ORDER — METRONIDAZOLE 0.75 % VA GEL
1.0000 | Freq: Every day | VAGINAL | 0 refills | Status: AC
  Filled 2024-07-24: qty 70, 7d supply, fill #0

## 2024-07-24 MED ORDER — NITROFURANTOIN MONOHYD MACRO 100 MG PO CAPS
100.0000 mg | ORAL_CAPSULE | Freq: Two times a day (BID) | ORAL | 0 refills | Status: DC
  Filled 2024-07-24: qty 14, 7d supply, fill #0

## 2024-07-24 NOTE — Progress Notes (Signed)
      Subjective: Jill Mccann is a 23 y.o. female who complains of dysuria, frequency, no urgency, vaginal itching, no discharge, lower back pain. Symptoms began 5 days ago. Started provera  07/19/24, no menses since Nexplanon  removed 04/14/24.    Review of Systems  All other systems reviewed and are negative.   Past Medical History:  Diagnosis Date   ADHD    Anxiety    Depression    Eczema    HTN (hypertension)    Leg swelling    Obesity    PCOS (polycystic ovarian syndrome)       Objective:  Today's Vitals   07/24/24 1327  BP: 132/80  Temp: 98.7 F (37.1 C)  TempSrc: Oral  Weight: (!) 337 lb (152.9 kg)   Body mass index is 56.08 kg/m.   Physical Exam Vitals and nursing note reviewed. Exam conducted with a chaperone present.  Constitutional:      Appearance: Normal appearance. She is well-developed.  Pulmonary:     Effort: Pulmonary effort is normal.  Abdominal:     General: Abdomen is flat.     Palpations: Abdomen is soft.  Genitourinary:    General: Normal vulva.     Vagina: Vaginal discharge present. No erythema, bleeding or lesions.     Cervix: Normal. No discharge, friability, lesion or erythema.     Uterus: Normal.      Adnexa: Right adnexa normal and left adnexa normal.  Neurological:     Mental Status: She is alert.  Psychiatric:        Mood and Affect: Mood normal.        Thought Content: Thought content normal.        Judgment: Judgment normal.     Urine dipstick shows positive for RBC's, positive for nitrates, and positive for leukocytes.  Micro exam: 10-20 WBC's per HPF and moderate+ bacteria.   Microscopic wet-mount exam shows clue cells.   [name], CMA present for exam  Assessment:/Plan:  1. Dysuria (Primary) - Urinalysis,Complete w/RFL Culture  2. Vaginal itching - WET PREP FOR TRICH, YEAST, CLUE - metroNIDAZOLE (METROGEL) 0.75 % vaginal gel; Place 1 Applicatorful vaginally at bedtime for 5 days.  Dispense: 70 g; Refill:  0  3. Cystitis without hematuria - nitrofurantoin, macrocrystal-monohydrate, (MACROBID) 100 MG capsule; Take 1 capsule (100 mg total) by mouth 2 (two) times daily.  Dispense: 14 capsule; Refill: 0   Will contact patient with results of testing completed today. Avoid intercourse until symptoms are resolved. Safe sex encouraged. Avoid the use of soaps or perfumed products in the peri area. Avoid tub baths and sitting in sweaty or wet clothing for prolonged periods of time.     Dearis Danis B, NP 1:37 PM

## 2024-07-24 NOTE — Telephone Encounter (Signed)
 AEX 05/25/24  Jami -please advise if OV or lab?

## 2024-07-24 NOTE — Telephone Encounter (Signed)
 Can she come at 11:45 ( for the 12 slot) or 3pm? Needs bc will need pelvic and uti workup.

## 2024-07-26 ENCOUNTER — Ambulatory Visit: Payer: Self-pay | Admitting: Radiology

## 2024-07-26 LAB — URINALYSIS, COMPLETE W/RFL CULTURE
Bilirubin Urine: NEGATIVE
Glucose, UA: NEGATIVE
Hyaline Cast: NONE SEEN /LPF
Ketones, ur: NEGATIVE
Nitrites, Initial: POSITIVE — AB
Protein, ur: NEGATIVE
Specific Gravity, Urine: 1.02 (ref 1.001–1.035)
pH: 6 (ref 5.0–8.0)

## 2024-07-26 LAB — URINE CULTURE
MICRO NUMBER:: 17053682
SPECIMEN QUALITY:: ADEQUATE

## 2024-07-26 LAB — CULTURE INDICATED

## 2024-07-28 ENCOUNTER — Other Ambulatory Visit (HOSPITAL_BASED_OUTPATIENT_CLINIC_OR_DEPARTMENT_OTHER): Payer: Self-pay

## 2024-07-28 ENCOUNTER — Other Ambulatory Visit: Payer: Self-pay | Admitting: Radiology

## 2024-07-28 DIAGNOSIS — R110 Nausea: Secondary | ICD-10-CM

## 2024-07-28 MED ORDER — ONDANSETRON HCL 4 MG PO TABS
4.0000 mg | ORAL_TABLET | Freq: Three times a day (TID) | ORAL | 0 refills | Status: DC | PRN
  Filled 2024-07-28: qty 18, 21d supply, fill #0

## 2024-08-10 ENCOUNTER — Other Ambulatory Visit: Payer: Self-pay | Admitting: Physician Assistant

## 2024-08-11 ENCOUNTER — Other Ambulatory Visit (HOSPITAL_BASED_OUTPATIENT_CLINIC_OR_DEPARTMENT_OTHER): Payer: Self-pay

## 2024-08-11 ENCOUNTER — Other Ambulatory Visit: Payer: Self-pay

## 2024-08-11 MED ORDER — SERTRALINE HCL 25 MG PO TABS
25.0000 mg | ORAL_TABLET | Freq: Every day | ORAL | 2 refills | Status: DC
  Filled 2024-08-11: qty 30, 30d supply, fill #0
  Filled 2024-09-14: qty 30, 30d supply, fill #1
  Filled 2024-10-12: qty 30, 30d supply, fill #2

## 2024-08-17 ENCOUNTER — Other Ambulatory Visit (HOSPITAL_BASED_OUTPATIENT_CLINIC_OR_DEPARTMENT_OTHER): Payer: Self-pay

## 2024-08-20 ENCOUNTER — Ambulatory Visit: Admitting: Physician Assistant

## 2024-08-20 ENCOUNTER — Other Ambulatory Visit (HOSPITAL_BASED_OUTPATIENT_CLINIC_OR_DEPARTMENT_OTHER): Payer: Self-pay

## 2024-08-20 ENCOUNTER — Encounter: Payer: Self-pay | Admitting: Physician Assistant

## 2024-08-20 VITALS — BP 130/88 | HR 88 | Temp 97.7°F | Ht 65.0 in | Wt 332.2 lb

## 2024-08-20 DIAGNOSIS — M545 Low back pain, unspecified: Secondary | ICD-10-CM

## 2024-08-20 DIAGNOSIS — F419 Anxiety disorder, unspecified: Secondary | ICD-10-CM

## 2024-08-20 DIAGNOSIS — R110 Nausea: Secondary | ICD-10-CM

## 2024-08-20 DIAGNOSIS — Z23 Encounter for immunization: Secondary | ICD-10-CM | POA: Diagnosis not present

## 2024-08-20 LAB — COMPREHENSIVE METABOLIC PANEL WITH GFR
ALT: 24 U/L (ref 0–35)
AST: 16 U/L (ref 0–37)
Albumin: 4.2 g/dL (ref 3.5–5.2)
Alkaline Phosphatase: 67 U/L (ref 39–117)
BUN: 18 mg/dL (ref 6–23)
CO2: 31 meq/L (ref 19–32)
Calcium: 9.4 mg/dL (ref 8.4–10.5)
Chloride: 100 meq/L (ref 96–112)
Creatinine, Ser: 1.29 mg/dL — ABNORMAL HIGH (ref 0.40–1.20)
GFR: 58.66 mL/min — ABNORMAL LOW (ref >60.00)
Glucose, Bld: 85 mg/dL (ref 70–99)
Potassium: 3.7 meq/L (ref 3.5–5.1)
Sodium: 141 meq/L (ref 135–145)
Total Bilirubin: 0.7 mg/dL (ref 0.2–1.2)
Total Protein: 6.9 g/dL (ref 6.0–8.3)

## 2024-08-20 LAB — CBC WITH DIFFERENTIAL/PLATELET
Basophils Absolute: 0.1 K/uL (ref 0.0–0.1)
Basophils Relative: 0.8 % (ref 0.0–3.0)
Eosinophils Absolute: 0.1 K/uL (ref 0.0–0.7)
Eosinophils Relative: 1.3 % (ref 0.0–5.0)
HCT: 42.5 % (ref 36.0–46.0)
Hemoglobin: 14.2 g/dL (ref 12.0–15.0)
Lymphocytes Relative: 26.7 % (ref 12.0–46.0)
Lymphs Abs: 2.2 K/uL (ref 0.7–4.0)
MCHC: 33.4 g/dL (ref 30.0–36.0)
MCV: 86.5 fl (ref 78.0–100.0)
Monocytes Absolute: 0.6 K/uL (ref 0.1–1.0)
Monocytes Relative: 7.4 % (ref 3.0–12.0)
Neutro Abs: 5.2 K/uL (ref 1.4–7.7)
Neutrophils Relative %: 63.8 % (ref 43.0–77.0)
Platelets: 214 K/uL (ref 150.0–400.0)
RBC: 4.91 Mil/uL (ref 3.87–5.11)
RDW: 14 % (ref 11.5–15.5)
WBC: 8.2 K/uL (ref 4.0–10.5)

## 2024-08-20 LAB — URINALYSIS, ROUTINE W REFLEX MICROSCOPIC
Bilirubin Urine: NEGATIVE
Ketones, ur: NEGATIVE
Leukocytes,Ua: NEGATIVE
Nitrite: NEGATIVE
Specific Gravity, Urine: 1.01 (ref 1.000–1.030)
Total Protein, Urine: NEGATIVE
Urine Glucose: NEGATIVE
Urobilinogen, UA: 0.2 (ref 0.0–1.0)
pH: 7 (ref 5.0–8.0)

## 2024-08-20 MED ORDER — CIPROFLOXACIN HCL 500 MG PO TABS
500.0000 mg | ORAL_TABLET | Freq: Two times a day (BID) | ORAL | 0 refills | Status: AC
  Filled 2024-08-20: qty 14, 7d supply, fill #0

## 2024-08-20 MED ORDER — CEFTRIAXONE SODIUM 1 G IJ SOLR
1.0000 g | Freq: Once | INTRAMUSCULAR | Status: AC
  Administered 2024-08-20: 1 g via INTRAMUSCULAR

## 2024-08-20 MED ORDER — ONDANSETRON HCL 4 MG PO TABS
4.0000 mg | ORAL_TABLET | Freq: Three times a day (TID) | ORAL | 0 refills | Status: DC | PRN
  Filled 2024-08-20: qty 30, 10d supply, fill #0

## 2024-08-20 NOTE — Progress Notes (Signed)
 Jill Mccann is a 23 y.o. female here for a follow up of a pre-existing problem.  History of Present Illness:   Chief Complaint  Patient presents with   Nausea    Pt c/o nausea all the time, no vomiting.   Back Pain    Pt c/o low back pain started yesterday afternoon, recently treated for UTI and thinks it might be back. Completed abx Macrobid.    Discussed the use of AI scribe software for clinical note transcription with the patient, who gave verbal consent to proceed.  History of Present Illness   Jill Mccann is a 23 year old female who presents with recurrent urinary symptoms and nausea.  She experienced her first UTI recently, confirmed by testing and culture as E. coli, and was treated with Macrobid for one week. During treatment, she developed worsening back pain and significant nausea, including projectile vomiting of stomach acid. Nausea persisted despite Zofran  use. After completing the antibiotic course, she experienced spotting and cramping, although her period is not due until November.  She has recurrent nausea, which she associates with anxiety, and has been taking Zoloft  since May. Nausea has worsened over the past month, managed with Zofran . Intermittent back pain is attributed to sleeping on the couch due to anxiety-related discomfort in her bedroom.  She takes Provera  for ten days each month, with the next course starting in November. She uses Zoloft  daily, except when she hasn't eaten, to avoid nausea. No abdominal pain, but a resolved stabbing sensation in the lower abdomen. No blood in urine and no white floaty particles, as noticed during her previous UTI.  Anxiety is primarily health-related and exacerbated by current symptoms. She has attempted therapy but faced appointment cancellations and is open to exploring other options.        Past Medical History:  Diagnosis Date   ADHD    Anxiety    Depression    Eczema    HTN (hypertension)    Leg swelling     Obesity    PCOS (polycystic ovarian syndrome)      Social History   Tobacco Use   Smoking status: Never    Passive exposure: Never   Smokeless tobacco: Never  Substance Use Topics   Alcohol use: Not Currently   Drug use: No    Past Surgical History:  Procedure Laterality Date   nexplanon   02/21/2021   Left ARM   TONSILLECTOMY      Family History  Problem Relation Age of Onset   Arthritis Mother    Depression Mother    Hypertension Mother    Miscarriages / Stillbirths Mother    Arthritis Father    Diabetes Father    Heart disease Father    Hyperlipidemia Father    Hypertension Father    Asthma Sister    Stroke Maternal Grandmother    Alzheimer's disease Maternal Grandmother    Alcohol abuse Maternal Grandfather    Early death Maternal Grandfather    Hearing loss Maternal Grandfather    Hyperlipidemia Maternal Grandfather    Arthritis Paternal Grandmother    Diabetes Paternal Grandfather    Hypertension Paternal Grandfather    Kidney disease Paternal Grandfather     Allergies  Allergen Reactions   Justicia Adhatoda (Malabar Nut Tree) [Justicia Adhatoda] Anaphylaxis   Macrobid [Nitrofurantoin] Nausea And Vomiting   Other Anaphylaxis    Pecans   Hydrocodone Nausea And Vomiting    Current Medications:   Current Outpatient Medications:    chlorthalidone  (  HYGROTON ) 25 MG tablet, Take 1 tablet (25 mg total) by mouth daily., Disp: 90 tablet, Rfl: 1   ciprofloxacin (CIPRO) 500 MG tablet, Take 1 tablet (500 mg total) by mouth 2 (two) times daily for 7 days., Disp: 14 tablet, Rfl: 0   hydrOXYzine  (ATARAX ) 10 MG tablet, Take 1 tablet (10 mg total) by mouth 3 (three) times daily as needed., Disp: 30 tablet, Rfl: 1   losartan  (COZAAR ) 100 MG tablet, Take 1 tablet (100 mg total) by mouth daily., Disp: 90 tablet, Rfl: 1   medroxyPROGESTERone  (PROVERA ) 10 MG tablet, Take 1 tablet (10 mg total) by mouth daily. (Patient taking differently: Take 10 mg by mouth daily. Takes it  10 days out of the month.), Disp: 10 tablet, Rfl: 3   propranolol  (INDERAL ) 20 MG tablet, Take 1 tablet (20 mg total) by mouth 3 (three) times daily as needed., Disp: 30 tablet, Rfl: 1   sertraline  (ZOLOFT ) 25 MG tablet, Take 1 tablet (25 mg total) by mouth daily., Disp: 30 tablet, Rfl: 2   ondansetron  (ZOFRAN ) 4 MG tablet, Take 1 tablet (4 mg total) by mouth every 8 (eight) hours as needed for nausea or vomiting., Disp: 30 tablet, Rfl: 0  Current Facility-Administered Medications:    [COMPLETED] cefTRIAXone (ROCEPHIN) injection 1 g, 1 g, Intramuscular, Once, [place], [place], [place], 1 g at 08/20/24 1150   Review of Systems:   Negative unless otherwise specified per HPI.  Vitals:   Vitals:   08/20/24 1025  BP: 130/88  Pulse: 88  Temp: 97.7 F (36.5 C)  TempSrc: Temporal  SpO2: 94%  Weight: (!) 332 lb 4 oz (150.7 kg)  Height: 5' 5 (1.651 m)     Body mass index is 55.29 kg/m.  Physical Exam:   Physical Exam Vitals and nursing note reviewed.  Constitutional:      General: She is not in acute distress.    Appearance: She is well-developed. She is not ill-appearing or toxic-appearing.  Cardiovascular:     Rate and Rhythm: Normal rate and regular rhythm.     Pulses: Normal pulses.     Heart sounds: Normal heart sounds, S1 normal and S2 normal.  Pulmonary:     Effort: Pulmonary effort is normal.     Breath sounds: Normal breath sounds.  Abdominal:     Tenderness: There is no right CVA tenderness or left CVA tenderness.  Musculoskeletal:     Comments: No tenderness to palpation to spine Normal range of motion of back  Skin:    General: Skin is warm and dry.  Neurological:     Mental Status: She is alert.     GCS: GCS eye subscore is 4. GCS verbal subscore is 5. GCS motor subscore is 6.  Psychiatric:        Speech: Speech normal.        Behavior: Behavior normal. Behavior is cooperative.     Assessment and Plan:    Assessment and Plan    Acute bilateral low back  pain without sciatica  Recurrent UTI with previous E. coli. Intermittent pain, not consistent with kidney pain. No abdominal pain or constipation. Symptoms suggest possible kidney involvement. Urine test strips borderline. Patient prefers immediate treatment due to past symptom escalation. Negative upt at home. - Administered 1 gram Rocephin injection. - Ordered urine analysis and culture. - Ordered CBC and CMP - Prescribed oral antibiotic to start tomorrow pending lab results.  Nausea Persistent nausea possibly due to anxiety, medication side effects, or UTI.  Zofran  provides partial relief.  - Continue Zofran  as needed. - Refilled Zofran  prescription.  Anxiety disorder Health-related anxiety worsened by current health issues. Previous therapy ineffective due to cancellations. Sertraline  taken consistently. - Continue Zoloft  25 mg daily, declines refill - Provided information of two therapy groups with good availability and insurance acceptance.  General Health Maintenance - Administered flu shot.      Lucie Buttner, PA-C

## 2024-08-20 NOTE — Patient Instructions (Signed)
 It was great to see you!  For counseling, call and look into [facility] or [facility]  Antibiotic(s) injection today  Start ciprofloxacin tomorrow  I will be in touch with all results when they return  64 oz water daily  Take care,  Lucie Buttner PA-C

## 2024-08-21 ENCOUNTER — Ambulatory Visit: Payer: Self-pay | Admitting: Physician Assistant

## 2024-08-21 DIAGNOSIS — R944 Abnormal results of kidney function studies: Secondary | ICD-10-CM

## 2024-08-21 LAB — URINE CULTURE
MICRO NUMBER:: 17169878
SPECIMEN QUALITY:: ADEQUATE

## 2024-08-27 ENCOUNTER — Ambulatory Visit: Payer: Self-pay | Admitting: Physician Assistant

## 2024-08-27 ENCOUNTER — Other Ambulatory Visit (INDEPENDENT_AMBULATORY_CARE_PROVIDER_SITE_OTHER)

## 2024-08-27 DIAGNOSIS — R944 Abnormal results of kidney function studies: Secondary | ICD-10-CM | POA: Diagnosis not present

## 2024-08-27 LAB — COMPREHENSIVE METABOLIC PANEL WITH GFR
ALT: 31 U/L (ref 0–35)
AST: 19 U/L (ref 0–37)
Albumin: 4 g/dL (ref 3.5–5.2)
Alkaline Phosphatase: 59 U/L (ref 39–117)
BUN: 14 mg/dL (ref 6–23)
CO2: 28 meq/L (ref 19–32)
Calcium: 9.6 mg/dL (ref 8.4–10.5)
Chloride: 101 meq/L (ref 96–112)
Creatinine, Ser: 0.91 mg/dL (ref 0.40–1.20)
GFR: 89.15 mL/min (ref >60.00)
Glucose, Bld: 90 mg/dL (ref 70–99)
Potassium: 3.6 meq/L (ref 3.5–5.1)
Sodium: 138 meq/L (ref 135–145)
Total Bilirubin: 0.6 mg/dL (ref 0.2–1.2)
Total Protein: 6.7 g/dL (ref 6.0–8.3)

## 2024-09-14 ENCOUNTER — Other Ambulatory Visit (HOSPITAL_BASED_OUTPATIENT_CLINIC_OR_DEPARTMENT_OTHER): Payer: Self-pay

## 2024-09-22 ENCOUNTER — Ambulatory Visit: Admitting: Physician Assistant

## 2024-10-06 ENCOUNTER — Ambulatory Visit: Admitting: Radiology

## 2024-10-12 ENCOUNTER — Encounter: Payer: Self-pay | Admitting: Radiology

## 2024-10-12 ENCOUNTER — Ambulatory Visit: Admitting: Radiology

## 2024-10-12 ENCOUNTER — Other Ambulatory Visit (HOSPITAL_BASED_OUTPATIENT_CLINIC_OR_DEPARTMENT_OTHER): Payer: Self-pay

## 2024-10-12 VITALS — BP 118/80 | HR 88 | Wt 337.0 lb

## 2024-10-12 DIAGNOSIS — Z30011 Encounter for initial prescription of contraceptive pills: Secondary | ICD-10-CM | POA: Diagnosis not present

## 2024-10-12 DIAGNOSIS — Z6841 Body Mass Index (BMI) 40.0 and over, adult: Secondary | ICD-10-CM | POA: Diagnosis not present

## 2024-10-12 MED ORDER — ZEPBOUND 2.5 MG/0.5ML ~~LOC~~ SOLN: 2.5000 mg | SUBCUTANEOUS | 0 refills | Status: DC

## 2024-10-12 MED ORDER — NORETHINDRONE 0.35 MG PO TABS
1.0000 | ORAL_TABLET | Freq: Every day | ORAL | 2 refills | Status: AC
  Filled 2024-10-12: qty 84, 84d supply, fill #0

## 2024-10-12 NOTE — Progress Notes (Signed)
" ° °  Jill Mccann January 30, 2001 [id number]   History:  23 y.o. G0 c/o when she takes provera  she is having heavy clotting while having bleeding. In the last month when her period ended, she has still been having spotting. Nexplanon  removed 6 months ago. Partner is infertile. Continues to gain weight, reassuring labs with PCP. Interested in a GLP-1 now that cost is coming down.  Gynecologic History Patient's last menstrual period was 10/01/2024 (exact date). Period Duration (Days): 7-8 Period Pattern: Regular Menstrual Flow: Heavy Menstrual Control: Maxi pad, Tampon Dysmenorrhea: (!) Moderate Dysmenorrhea Symptoms: Cramping, Nausea Contraception/Family planning: condoms Sexually active: yes   Obstetric History OB History  Gravida Para Term Preterm AB Living  0 0 0 0 0 0  SAB IAB Ectopic Multiple Live Births  0 0 0 0 0      The following portions of the patient's history were reviewed and updated as appropriate: allergies, current medications, past family history, past medical history, past social history, past surgical history, and problem list.  ROS  Past medical history, past surgical history, family history and social history were all reviewed and documented in the EPIC chart.  Exam:  Vitals:   10/12/24 0827  BP: 118/80  Pulse: 88  SpO2: 98%  Weight: (!) 337 lb (152.9 kg)   Body mass index is 56.08 kg/m.  Physical Exam Vitals and nursing note reviewed. Exam conducted with a chaperone present.  Constitutional:      Appearance: Normal appearance. She is obese.  Pulmonary:     Effort: Pulmonary effort is normal.  Abdominal:     General: Abdomen is flat. Bowel sounds are normal.     Palpations: Abdomen is soft.  Genitourinary:    General: Normal vulva.     Vagina: Normal.     Cervix: Normal.     Uterus: Normal.   Neurological:     Mental Status: She is alert.  Psychiatric:        Mood and Affect: Mood normal.        Thought Content: Thought content normal.         Judgment: Judgment normal.      Jill Mccann, CMA present for exam  Assessment/Plan:   1. Oral contraception initiation (Primary) Will start daily POP to control cycles better. Weight loss will also improve. Does not want an IUD or another Nexplanon . - norethindrone  (MICRONOR ) 0.35 MG tablet; Take 1 tablet (0.35 mg total) by mouth daily.  Dispense: 84 tablet; Refill: 2  2. BMI 50.0-59.9, adult [facility]) Coupon code given for 1 month free supply. Instructions for use reviewed. Will change to oral wegovy  next month once released, Schedule appt in 4 weeks for weight check and Rx for wegovy . Increase exercise. Continue to eat a mediterranean diet - tirzepatide  (ZEPBOUND ) 2.5 MG/0.5ML injection vial; Inject 2.5 mg into the skin once a week.  Dispense: 2 mL; Refill: 0     Return in about 4 weeks (around 11/09/2024) for Med Follow-up.  Jill Mccann B WHNP-BC 9:30 AM 10/12/2024 "

## 2024-11-09 ENCOUNTER — Other Ambulatory Visit (HOSPITAL_BASED_OUTPATIENT_CLINIC_OR_DEPARTMENT_OTHER): Payer: Self-pay

## 2024-11-09 ENCOUNTER — Other Ambulatory Visit: Payer: Self-pay | Admitting: Physician Assistant

## 2024-11-09 DIAGNOSIS — R110 Nausea: Secondary | ICD-10-CM

## 2024-11-10 ENCOUNTER — Other Ambulatory Visit: Payer: Self-pay

## 2024-11-10 ENCOUNTER — Encounter: Payer: Self-pay | Admitting: Radiology

## 2024-11-10 ENCOUNTER — Other Ambulatory Visit (HOSPITAL_BASED_OUTPATIENT_CLINIC_OR_DEPARTMENT_OTHER): Payer: Self-pay

## 2024-11-10 MED ORDER — SERTRALINE HCL 25 MG PO TABS
25.0000 mg | ORAL_TABLET | Freq: Every day | ORAL | 2 refills | Status: AC
  Filled 2024-11-10: qty 30, 30d supply, fill #0

## 2024-11-10 MED ORDER — ONDANSETRON HCL 4 MG PO TABS
4.0000 mg | ORAL_TABLET | Freq: Three times a day (TID) | ORAL | 0 refills | Status: AC | PRN
  Filled 2024-11-10: qty 30, 10d supply, fill #0

## 2024-11-12 ENCOUNTER — Ambulatory Visit: Payer: Self-pay | Admitting: Radiology

## 2024-11-12 VITALS — BP 132/84 | HR 86 | Wt 339.0 lb

## 2024-11-12 DIAGNOSIS — N921 Excessive and frequent menstruation with irregular cycle: Secondary | ICD-10-CM

## 2024-11-12 DIAGNOSIS — Z6841 Body Mass Index (BMI) 40.0 and over, adult: Secondary | ICD-10-CM

## 2024-11-12 NOTE — Progress Notes (Signed)
" ° °  Jill Mccann 2001-04-10 [id number]   History:  24 y.o. G0 started micronor  1 month ago, has had BTB. She originally reported when she takes provera  she is having heavy clotting while having bleeding. In the last month when her period ended, she has still been having spotting. Nexplanon  removed 6 months ago. Partner is infertile. Continues to gain weight, reassuring labs with PCP. Interested in a GLP-1 now that cost is coming down did not get zepbound  from [place] direct last month. Has new insurance, will be checking her coverage, if not covered will pay for oral wegovy .  Gynecologic History No LMP recorded.   Contraception/Family planning: condoms Sexually active: yes   Obstetric History OB History  Gravida Para Term Preterm AB Living  0 0 0 0 0 0  SAB IAB Ectopic Multiple Live Births  0 0 0 0 0      The following portions of the patient's history were reviewed and updated as appropriate: allergies, current medications, past family history, past medical history, past social history, past surgical history, and problem list.  Review of Systems  All other systems reviewed and are negative.   Past medical history, past surgical history, family history and social history were all reviewed and documented in the EPIC chart.  Exam:  Vitals:   11/12/24 0932  BP: 132/84  Pulse: 86  SpO2: 97%  Weight: (!) 339 lb (153.8 kg)   Body mass index is 56.41 kg/m.  Physical Exam Constitutional:      Appearance: Normal appearance. She is obese.  Pulmonary:     Effort: Pulmonary effort is normal.  Neurological:     Mental Status: She is alert.  Psychiatric:        Mood and Affect: Mood normal.        Thought Content: Thought content normal.        Judgment: Judgment normal.       Jill Mccann, CMA present for exam  Assessment/Plan:   1. Breakthrough bleeding on birth control pills (Primary) Continue current POPs, reassured this is a common side effect when first starting  2. BMI  50.0-59.9, adult [facility]) Will check insurance coverage and update me on status       Jill Mccann WHNP-BC 9:46 AM 11/12/2024 "

## 2024-11-13 ENCOUNTER — Other Ambulatory Visit (HOSPITAL_BASED_OUTPATIENT_CLINIC_OR_DEPARTMENT_OTHER): Payer: Self-pay
